# Patient Record
Sex: Male | Born: 1955 | Smoking: Never smoker
Health system: Southern US, Community
[De-identification: ages and names within clinical notes are randomized; demographics above are authoritative.]

## PROBLEM LIST (undated history)

## (undated) HISTORY — PX: WRIST SURGERY: SHX841

## (undated) HISTORY — PX: CHOLECYSTECTOMY: SHX55

---

## 2019-10-12 ENCOUNTER — Ambulatory Visit (INDEPENDENT_AMBULATORY_CARE_PROVIDER_SITE_OTHER): Payer: Self-pay | Admitting: Family Medicine

## 2019-10-12 ENCOUNTER — Encounter: Payer: Self-pay | Admitting: Family Medicine

## 2019-10-12 ENCOUNTER — Other Ambulatory Visit: Payer: Self-pay

## 2019-10-12 VITALS — BP 137/74 | HR 77 | Temp 99.0°F | Resp 18 | Ht 66.14 in | Wt 194.0 lb

## 2019-10-12 DIAGNOSIS — Z024 Encounter for examination for driving license: Secondary | ICD-10-CM

## 2019-10-12 NOTE — Progress Notes (Signed)
Subjective:  Patient ID: Blake Schmidt, male    DOB: 1956-03-24  Age: 63 y.o. MRN: [id number]  CC:  Chief Complaint  Patient presents with  . DOT Physical    HPI Blake Schmidt presents for   DOT physical.   Noted during chart review - positive Covid 19 test - did not disclose in check in/triage.  positive test 11/17, 11/12, and 10/29. Wants to have negative test.  Granddaughter had Covid 19 around 10/26-27.  No cough/dyspnea, no fever, no change in taste/smell. No dyspnea.  Granddaughter had negative repeat test. grandaughter lives with him.    DOT physical: Left arm fx in 1991 - no limitations.  No chronic medical problems.  No snoring, no daytime somnolence.  No CP with exertion.   History There are no active problems to display for this patient.  History reviewed. No pertinent past medical history. History reviewed. No pertinent surgical history. No Known Allergies Prior to Admission medications   Not on File   Social History   Socioeconomic History  . Marital status: Unknown    Spouse name: Not on file  . Number of children: Not on file  . Years of education: Not on file  . Highest education level: Not on file  Occupational History  . Not on file  Social Needs  . Financial resource strain: Not on file  . Food insecurity    Worry: Not on file    Inability: Not on file  . Transportation needs    Medical: Not on file    Non-medical: Not on file  Tobacco Use  . Smoking status: Never Smoker  . Smokeless tobacco: Never Used  Substance and Sexual Activity  . Alcohol use: Not on file  . Drug use: Not on file  . Sexual activity: Not on file  Lifestyle  . Physical activity    Days per week: Not on file    Minutes per session: Not on file  . Stress: Not on file  Relationships  . Social Musician on phone: Not on file    Gets together: Not on file    Attends religious service: Not on file    Active member of club or organization: Not on file     Attends meetings of clubs or organizations: Not on file    Relationship status: Not on file  . Intimate partner violence    Fear of current or ex partner: Not on file    Emotionally abused: Not on file    Physically abused: Not on file    Forced sexual activity: Not on file  Other Topics Concern  . Not on file  Social History Narrative  . Not on file    Review of Systems  Driver form reviewed, no concerns. See ppwk.   Objective:   Vitals:   10/12/19 1322  BP: 137/74  Pulse: 77  Resp: 18  Temp: 99 F (37.2 C)  TempSrc: Oral  SpO2: 96%  Weight: 194 lb (88 kg)  Height: 5' 6.14" (1.68 m)     Physical Exam Vitals signs reviewed.  Constitutional:      Appearance: He is well-developed.  HENT:     Head: Normocephalic and atraumatic.     Right Ear: External ear normal.     Left Ear: External ear normal.  Eyes:     Conjunctiva/sclera: Conjunctivae normal.     Pupils: Pupils are equal, round, and reactive to light.  Neck:     Musculoskeletal:  Normal range of motion and neck supple.     Thyroid: No thyromegaly.  Cardiovascular:     Rate and Rhythm: Normal rate and regular rhythm.     Heart sounds: Normal heart sounds.  Pulmonary:     Effort: Pulmonary effort is normal. No respiratory distress.     Breath sounds: Normal breath sounds. No wheezing.  Abdominal:     General: There is no distension.     Palpations: Abdomen is soft.     Tenderness: There is no abdominal tenderness.     Hernia: There is no hernia in the left inguinal area or right inguinal area.  Musculoskeletal: Normal range of motion.        General: No tenderness.  Lymphadenopathy:     Cervical: No cervical adenopathy.  Skin:    General: Skin is warm and dry.  Neurological:     Mental Status: He is alert and oriented to person, place, and time.     Deep Tendon Reflexes: Reflexes are normal and symmetric.  Psychiatric:        Behavior: Behavior normal.        Assessment & Plan:  Blake Schmidt is a 63 y.o. male . 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  Discussed recent COVID-19 positive test.  Initial test was positive on October 29, and has remained asymptomatic during the entire time.  Beyond 10 days from initial positive test and asymptomatic, unlikely contagious at this time.  Recommend against further repeat testing.  No orders of the defined types were placed in this encounter.  Patient Instructions       If you have lab work done today you will be contacted with your lab results within the next 2 weeks.  If you have not heard from [place] then please contact us. The fastest way to get your results is to register for My Chart.   IF you received an x-ray today, you will receive an invoice from [facility]. Please contact [facility] at [phone] with questions or concerns regarding your invoice.   IF you received labwork today, you will receive an invoice from [facility]. Please contact [facility] at [phone] with questions or concerns regarding your invoice.   Our billing staff will not be able to assist you with questions regarding bills from these companies.  You will be contacted with the lab results as soon as they are available. The fastest way to get your results is to activate your My Chart account. Instructions are located on the last page of this paperwork. If you have not heard from [place] regarding the results in 2 weeks, please contact this office.          Signed, Merri Ray, MD [facility]

## 2019-10-12 NOTE — Patient Instructions (Addendum)
   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   If you have lab work done today you will be contacted with your lab results within the next 2 weeks.  If you have not heard from [place] then please contact us. The fastest way to get your results is to register for My Chart.   IF you received an x-ray today, you will receive an invoice from [facility]. Please contact [facility] at [phone] with questions or concerns regarding your invoice.   IF you received labwork today, you will receive an invoice from [facility]. Please contact [facility] at [phone] with questions or concerns regarding your invoice.   Our billing staff will not be able to assist you with questions regarding bills from these companies.  You will be contacted with the lab results as soon as they are available. The fastest way to get your results is to activate your My Chart account. Instructions are located on the last page of this paperwork. If you have not heard from [place] regarding the results in 2 weeks, please contact this office.

## 2020-05-30 ENCOUNTER — Ambulatory Visit (INDEPENDENT_AMBULATORY_CARE_PROVIDER_SITE_OTHER): Payer: Medicare Other

## 2020-05-30 ENCOUNTER — Encounter (HOSPITAL_COMMUNITY): Payer: Self-pay

## 2020-05-30 ENCOUNTER — Other Ambulatory Visit: Payer: Self-pay

## 2020-05-30 ENCOUNTER — Ambulatory Visit (HOSPITAL_COMMUNITY)
Admission: EM | Admit: 2020-05-30 | Discharge: 2020-05-30 | Disposition: A | Discharge status: Home / Self Care | Payer: Medicare Other | Attending: Family Medicine | Admitting: Family Medicine

## 2020-05-30 DIAGNOSIS — R52 Pain, unspecified: Secondary | ICD-10-CM

## 2020-05-30 DIAGNOSIS — K529 Noninfective gastroenteritis and colitis, unspecified: Secondary | ICD-10-CM | POA: Insufficient documentation

## 2020-05-30 DIAGNOSIS — R509 Fever, unspecified: Secondary | ICD-10-CM | POA: Diagnosis not present

## 2020-05-30 DIAGNOSIS — R112 Nausea with vomiting, unspecified: Secondary | ICD-10-CM | POA: Diagnosis not present

## 2020-05-30 DIAGNOSIS — R109 Unspecified abdominal pain: Secondary | ICD-10-CM | POA: Diagnosis not present

## 2020-05-30 DIAGNOSIS — R197 Diarrhea, unspecified: Secondary | ICD-10-CM

## 2020-05-30 LAB — COMPREHENSIVE METABOLIC PANEL
ALT: 113 U/L — ABNORMAL HIGH (ref 0–44)
AST: 27 U/L (ref 15–41)
Albumin: 3.1 g/dL — ABNORMAL LOW (ref 3.5–5.0)
Alkaline Phosphatase: 149 U/L — ABNORMAL HIGH (ref 38–126)
Anion gap: 7 (ref 5–15)
BUN: 11 mg/dL (ref 8–23)
CO2: 27 mmol/L (ref 22–32)
Calcium: 8.9 mg/dL (ref 8.9–10.3)
Chloride: 109 mmol/L (ref 98–111)
Creatinine, Ser: 1.13 mg/dL (ref 0.61–1.24)
GFR calc Af Amer: >60 mL/min (ref >60)
GFR calc non Af Amer: >60 mL/min (ref >60)
Glucose, Bld: 107 mg/dL — ABNORMAL HIGH (ref 70–99)
Potassium: 4.4 mmol/L (ref 3.5–5.1)
Sodium: 143 mmol/L (ref 135–145)
Total Bilirubin: 1.3 mg/dL — ABNORMAL HIGH (ref 0.3–1.2)
Total Protein: 7.2 g/dL (ref 6.5–8.1)

## 2020-05-30 LAB — CBC WITH DIFFERENTIAL/PLATELET
Abs Immature Granulocytes: 0.03 10*3/uL (ref 0.00–0.07)
Basophils Absolute: 0 10*3/uL (ref 0.0–0.1)
Basophils Relative: 1 %
Eosinophils Absolute: 0.4 10*3/uL (ref 0.0–0.5)
Eosinophils Relative: 9 %
HCT: 44.2 % (ref 39.0–52.0)
Hemoglobin: 14.8 g/dL (ref 13.0–17.0)
Immature Granulocytes: 1 %
Lymphocytes Relative: 28 %
Lymphs Abs: 1.3 10*3/uL (ref 0.7–4.0)
MCH: 31.4 pg (ref 26.0–34.0)
MCHC: 33.5 g/dL (ref 30.0–36.0)
MCV: 93.8 fL (ref 80.0–100.0)
Monocytes Absolute: 0.5 10*3/uL (ref 0.1–1.0)
Monocytes Relative: 11 %
Neutro Abs: 2.4 10*3/uL (ref 1.7–7.7)
Neutrophils Relative %: 50 %
Platelets: 151 10*3/uL (ref 150–400)
RBC: 4.71 MIL/uL (ref 4.22–5.81)
RDW: 14.3 % (ref 11.5–15.5)
WBC: 4.6 10*3/uL (ref 4.0–10.5)
nRBC: 0 % (ref 0.0–0.2)

## 2020-05-30 LAB — POCT URINALYSIS DIP (DEVICE)
Glucose, UA: NEGATIVE mg/dL
Hgb urine dipstick: NEGATIVE
Leukocytes,Ua: NEGATIVE
Nitrite: NEGATIVE
Protein, ur: NEGATIVE mg/dL
Specific Gravity, Urine: 1.02 (ref 1.005–1.030)
Urobilinogen, UA: >=8 mg/dL (ref 0.0–1.0)
pH: 7.5 (ref 5.0–8.0)

## 2020-05-30 NOTE — ED Triage Notes (Addendum)
Pt reports fever (102.5-100.5 F) and body aches 3 days after eating tacos at at restaurant and also had diarrhea and abdominal pain x 2 days. Pt taking Tylenol, last dose 3-4 hrs ago.    Pt had a negative COVID test yesterday.

## 2020-05-30 NOTE — ED Provider Notes (Signed)
[facility]    CSN: [id number] Arrival date & time: 05/30/20  1305      History   Chief Complaint Chief Complaint  Patient presents with  . Fever  . Generalized Body Aches    HPI Blake Schmidt is a 64 y.o. male.   Pt is an overall healthy 64 year old male presenting with persistent generalized body aches and fever x 3 days. Reports eating tacos Sunday night and thinking this was the cause of his severe abdominal pain on Monday. Reports vomiting and diarrhea Monday and Tuesday. Reports increased urinary frequency but denies burning with urination, hematuria, or difficulty urinating. Has been drinking Pedialyte and water but still reports fatigue, fevers, and generalized aching. Has not been around anyone with similar symptoms. Received both Covid-19 vaccines and had negative Covid test yesterday. \ ROS per HPI      History reviewed. No pertinent past medical history.  There are no problems to display for this patient.   Past Surgical History:  Procedure Laterality Date  . WRIST SURGERY         Home Medications    Prior to Admission medications   Medication Sig Start Date End Date Taking? Authorizing Provider  acetaminophen (TYLENOL) 500 MG tablet Take 500 mg by mouth every 6 (six) hours as needed.   Yes [provider]    Family History History reviewed. No pertinent family history.  Social History Social History   Tobacco Use  . Smoking status: Never Smoker  . Smokeless tobacco: Never Used  Substance Use Topics  . Alcohol use: Never  . Drug use: Never     Allergies   Patient has no known allergies.   Review of Systems Review of Systems  Constitutional: Positive for appetite change, fatigue and fever.  HENT: Negative.   Respiratory: Negative.   Cardiovascular: Negative.   Gastrointestinal: Positive for abdominal pain, diarrhea, nausea and vomiting.       Monday/Tuesday  Endocrine: Negative for polydipsia, polyphagia and  polyuria.  Genitourinary: Positive for frequency. Negative for difficulty urinating, dysuria, hematuria and urgency.  Musculoskeletal: Positive for back pain.       Low back and mid-left back pain  Neurological: Positive for weakness.       Generalized     Physical Exam Triage Vital Signs ED Triage Vitals  Enc Vitals Group     BP 05/30/20 1358 116/81     Pulse --      Resp 05/30/20 1358 20     Temp 05/30/20 1358 98.7 F (37.1 C)     Temp Source 05/30/20 1358 Oral     SpO2 05/30/20 1358 98 %     Weight --      Height --      Head Circumference --      Peak Flow --      Pain Score 05/30/20 1354 0     Pain Loc --      Pain Edu? --      Excl. in GC? --    No data found.  Updated Vital Signs BP 116/81 (BP Location: Right Arm)   Temp 98.7 F (37.1 C) (Oral)   Resp 20   SpO2 98%   Visual Acuity Right Eye Distance:   Left Eye Distance:   Bilateral Distance:    Right Eye Near:   Left Eye Near:    Bilateral Near:     Physical Exam Constitutional:      Appearance: Normal appearance. He is normal weight.  HENT:     Head: Normocephalic.  Cardiovascular:     Rate and Rhythm: Normal rate.  Pulmonary:     Effort: Pulmonary effort is normal.  Abdominal:     General: Abdomen is flat. Bowel sounds are normal. There is no distension.     Palpations: Abdomen is soft. There is no mass.     Tenderness: There is abdominal tenderness. There is left CVA tenderness. There is no right CVA tenderness or guarding.     Hernia: No hernia is present.     Comments: RUQ tenderness  Musculoskeletal:        General: Normal range of motion.  Skin:    General: Skin is warm and dry.     Capillary Refill: Capillary refill takes less than 2 seconds.  Neurological:     General: No focal deficit present.     Mental Status: He is alert.  Psychiatric:        Mood and Affect: Mood normal.        Behavior: Behavior normal. Behavior is cooperative.      UC Treatments / Results   Labs (all labs ordered are listed, but only abnormal results are displayed) Labs Reviewed  COMPREHENSIVE METABOLIC PANEL - Abnormal; Notable for the following components:      Result Value   Glucose, Bld 107 (*)    Albumin 3.1 (*)    ALT 113 (*)    Alkaline Phosphatase 149 (*)    Total Bilirubin 1.3 (*)    All other components within normal limits  POCT URINALYSIS DIP (DEVICE) - Abnormal; Notable for the following components:   Bilirubin Urine SMALL (*)    Ketones, ur TRACE (*)    All other components within normal limits  CBC WITH DIFFERENTIAL/PLATELET    EKG   Radiology No results found.  Procedures Procedures (including critical care time)  Medications Ordered in UC Medications - No data to display  Initial Impression / Assessment and Plan / UC Course  I have reviewed the triage vital signs and the nursing notes.  Pertinent labs & imaging results that were available during my care of the patient were reviewed by me and considered in my medical decision making (see chart for details).  Clinical Course as of Jun 02 1539  Fri May 30, 2020  1456 POCT Urinalysis, Dipstick [TB]  1535 DG Abd 2 Views [TB]    Clinical Course User Index [TB] Dahlia Byes A, NP   Gastroenteritis Patient presented today with fever, nausea, vomiting and diarrhea after eating tacos a few days ago.  Most symptoms have resolved at this time.  Still having some right upper quadrant discomfort, generalized aching and fatigue. We will draw some basic lab work Abdominal x-ray without any concerning findings. Urine without infection There is still some concern for cholecystitis Patient otherwise nontoxic or ill-appearing at this time.  Will follow up with lab work We will have him follow-up with primary care or go to the ER for worsening symptoms.   Final Clinical Impressions(s) / UC Diagnoses   Final diagnoses:  Gastroenteritis     Discharge Instructions     Your x-ray did not show  anything concerning.  Your blood work so far is normal.  We are still waiting on one of your labs and I will call you if any of these results are abnormal. Your urine did not show any infection. Make sure you are drinking plenty of fluids and staying hydrated If your symptoms worsen to  include more severe abdominal pain, nausea or vomiting you will need to go to the ER for further management.  Otherwise you  can follow with primary care    ED Prescriptions    None     PDMP not reviewed this encounter.   Janace Aris, NP 06/01/20 1540

## 2020-05-30 NOTE — Discharge Instructions (Signed)
Your x-ray did not show anything concerning.  Your blood work so far is normal.  We are still waiting on one of your labs and I will call you if any of these results are abnormal. Your urine did not show any infection. Make sure you are drinking plenty of fluids and staying hydrated If your symptoms worsen to include more severe abdominal pain, nausea or vomiting you will need to go to the ER for further management.  Otherwise you  can follow with primary care

## 2020-12-12 ENCOUNTER — Other Ambulatory Visit: Payer: Self-pay

## 2020-12-12 ENCOUNTER — Inpatient Hospital Stay (HOSPITAL_COMMUNITY)
Admission: EM | Admit: 2020-12-12 | Discharge: 2020-12-16 | DRG: 444 | Disposition: A | Discharge status: Home / Self Care | Payer: Medicare Other | Attending: Internal Medicine | Admitting: Internal Medicine

## 2020-12-12 ENCOUNTER — Emergency Department (HOSPITAL_COMMUNITY): Payer: Medicare Other

## 2020-12-12 ENCOUNTER — Encounter (HOSPITAL_COMMUNITY): Payer: Self-pay | Admitting: *Deleted

## 2020-12-12 DIAGNOSIS — K851 Biliary acute pancreatitis without necrosis or infection: Secondary | ICD-10-CM | POA: Diagnosis not present

## 2020-12-12 DIAGNOSIS — N400 Enlarged prostate without lower urinary tract symptoms: Secondary | ICD-10-CM | POA: Diagnosis present

## 2020-12-12 DIAGNOSIS — K769 Liver disease, unspecified: Secondary | ICD-10-CM | POA: Diagnosis present

## 2020-12-12 DIAGNOSIS — Z9049 Acquired absence of other specified parts of digestive tract: Secondary | ICD-10-CM

## 2020-12-12 DIAGNOSIS — K8071 Calculus of gallbladder and bile duct without cholecystitis with obstruction: Secondary | ICD-10-CM | POA: Diagnosis not present

## 2020-12-12 DIAGNOSIS — Z20822 Contact with and (suspected) exposure to covid-19: Secondary | ICD-10-CM | POA: Diagnosis present

## 2020-12-12 DIAGNOSIS — K859 Acute pancreatitis without necrosis or infection, unspecified: Secondary | ICD-10-CM

## 2020-12-12 DIAGNOSIS — Z6831 Body mass index (BMI) 31.0-31.9, adult: Secondary | ICD-10-CM

## 2020-12-12 DIAGNOSIS — K831 Obstruction of bile duct: Secondary | ICD-10-CM | POA: Diagnosis present

## 2020-12-12 DIAGNOSIS — E669 Obesity, unspecified: Secondary | ICD-10-CM | POA: Diagnosis present

## 2020-12-12 LAB — COMPREHENSIVE METABOLIC PANEL
ALT: 202 U/L — ABNORMAL HIGH (ref 0–44)
AST: 79 U/L — ABNORMAL HIGH (ref 15–41)
Albumin: 4.1 g/dL (ref 3.5–5.0)
Alkaline Phosphatase: 430 U/L — ABNORMAL HIGH (ref 38–126)
Anion gap: 11 (ref 5–15)
BUN: 20 mg/dL (ref 8–23)
CO2: 24 mmol/L (ref 22–32)
Calcium: 9.7 mg/dL (ref 8.9–10.3)
Chloride: 103 mmol/L (ref 98–111)
Creatinine, Ser: 1.08 mg/dL (ref 0.61–1.24)
GFR, Estimated: >60 mL/min (ref >60)
Glucose, Bld: 126 mg/dL — ABNORMAL HIGH (ref 70–99)
Potassium: 4 mmol/L (ref 3.5–5.1)
Sodium: 138 mmol/L (ref 135–145)
Total Bilirubin: 5.5 mg/dL — ABNORMAL HIGH (ref 0.3–1.2)
Total Protein: 8.3 g/dL — ABNORMAL HIGH (ref 6.5–8.1)

## 2020-12-12 LAB — URINALYSIS, ROUTINE W REFLEX MICROSCOPIC
Glucose, UA: NEGATIVE mg/dL
Hgb urine dipstick: NEGATIVE
Ketones, ur: NEGATIVE mg/dL
Nitrite: NEGATIVE
Protein, ur: 30 mg/dL — AB
Specific Gravity, Urine: 1.03 (ref 1.005–1.030)
pH: 5 (ref 5.0–8.0)

## 2020-12-12 LAB — CBC
HCT: 44.2 % (ref 39.0–52.0)
Hemoglobin: 15 g/dL (ref 13.0–17.0)
MCH: 32.3 pg (ref 26.0–34.0)
MCHC: 33.9 g/dL (ref 30.0–36.0)
MCV: 95.3 fL (ref 80.0–100.0)
Platelets: 259 10*3/uL (ref 150–400)
RBC: 4.64 MIL/uL (ref 4.22–5.81)
RDW: 14.6 % (ref 11.5–15.5)
WBC: 13.7 10*3/uL — ABNORMAL HIGH (ref 4.0–10.5)
nRBC: 0 % (ref 0.0–0.2)

## 2020-12-12 LAB — LIPASE, BLOOD: Lipase: 1938 U/L — ABNORMAL HIGH (ref 11–51)

## 2020-12-12 LAB — SARS CORONAVIRUS 2 BY RT PCR (HOSPITAL ORDER, PERFORMED IN ~~LOC~~ HOSPITAL LAB): SARS Coronavirus 2: NEGATIVE

## 2020-12-12 MED ORDER — HYDROMORPHONE HCL 1 MG/ML IJ SOLN
0.5000 mg | INTRAMUSCULAR | Status: DC | PRN
  Administered 2020-12-13: 1 mg via INTRAVENOUS
  Filled 2020-12-12: qty 1

## 2020-12-12 MED ORDER — ONDANSETRON HCL 4 MG PO TABS: 4.0000 mg | ORAL_TABLET | Freq: Four times a day (QID) | ORAL | Status: DC | PRN

## 2020-12-12 MED ORDER — ACETAMINOPHEN 325 MG PO TABS: 650.0000 mg | ORAL_TABLET | Freq: Four times a day (QID) | ORAL | Status: DC | PRN

## 2020-12-12 MED ORDER — ONDANSETRON HCL 4 MG/2ML IJ SOLN
4.0000 mg | Freq: Once | INTRAMUSCULAR | Status: AC
  Administered 2020-12-12: 4 mg via INTRAVENOUS
  Filled 2020-12-12: qty 2

## 2020-12-12 MED ORDER — SODIUM CHLORIDE 0.9 % IV SOLN: Freq: Once | INTRAVENOUS | Status: AC

## 2020-12-12 MED ORDER — PIPERACILLIN-TAZOBACTAM 3.375 G IVPB 30 MIN
3.3750 g | Freq: Once | INTRAVENOUS | Status: AC
  Administered 2020-12-12: 3.375 g via INTRAVENOUS
  Filled 2020-12-12: qty 50

## 2020-12-12 MED ORDER — ACETAMINOPHEN 650 MG RE SUPP: 650.0000 mg | Freq: Four times a day (QID) | RECTAL | Status: DC | PRN

## 2020-12-12 MED ORDER — HYDROMORPHONE HCL 1 MG/ML IJ SOLN
1.0000 mg | Freq: Once | INTRAMUSCULAR | Status: AC | PRN
  Administered 2020-12-12: 1 mg via INTRAVENOUS
  Filled 2020-12-12: qty 1

## 2020-12-12 MED ORDER — SODIUM CHLORIDE 0.9 % IV SOLN: INTRAVENOUS | Status: DC

## 2020-12-12 MED ORDER — SODIUM CHLORIDE 0.9 % IV SOLN: INTRAVENOUS | Status: AC

## 2020-12-12 MED ORDER — SODIUM CHLORIDE 0.9 % IV BOLUS
1000.0000 mL | Freq: Once | INTRAVENOUS | Status: AC
  Administered 2020-12-12: 1000 mL via INTRAVENOUS

## 2020-12-12 MED ORDER — HYDROMORPHONE HCL 1 MG/ML IJ SOLN
1.0000 mg | Freq: Once | INTRAMUSCULAR | Status: AC
  Administered 2020-12-12: 1 mg via INTRAVENOUS
  Filled 2020-12-12: qty 1

## 2020-12-12 MED ORDER — ONDANSETRON HCL 4 MG/2ML IJ SOLN: 4.0000 mg | Freq: Four times a day (QID) | INTRAMUSCULAR | Status: DC | PRN

## 2020-12-12 MED ORDER — IOHEXOL 300 MG/ML  SOLN
100.0000 mL | Freq: Once | INTRAMUSCULAR | Status: AC | PRN
  Administered 2020-12-12: 100 mL via INTRAVENOUS

## 2020-12-12 NOTE — ED Triage Notes (Signed)
Pt had cholecystectomy 10/21. He states he began feeling pain in RUQ 2 weeks after surgery and has had pain since. Noi NVD.

## 2020-12-12 NOTE — ED Notes (Signed)
Urine culture sent to lab.

## 2020-12-12 NOTE — Consult Note (Signed)
Referring Provider: ED Primary Care Physician:  Patient, No Pcp Per Primary Gastroenterologist:  Gentry Fitz  Reason for Consultation:  Biliary obstruction, pancreatitis  HPI: Blake Schmidt is a 65 y.o. male with history of cholecystectomy in 08/2020 presenting with biliary obstruction and pancreatitis.  Patient states he had his gallbladder removed in October 2021.  He states he initially felt better for 2 or 3 weeks, then he started having intermittent right upper quadrant abdominal pain.  Pain typically lasts for 2 to 3 hours but then resolved.  However, yesterday, he started experiencing extreme pain in the right upper quadrant and epigastrium, and it did not get any better, thus he presented to the ED today.  He reports nausea but no vomiting.  Denies fever or chills.  Denies GERD, dysphagia, changes in appetite, unexplained weight loss, changes in bowel habits, melena, or hematochezia.  Denies alcohol use.  Denies NSAID or blood thinner use.  Denies any new medications.  Denies family history of biliary or pancreatic disorders.  He reports a colonoscopy in [place] ~5 years ago.   History reviewed. No pertinent past medical history.  Past Surgical History:  Procedure Laterality Date  . WRIST SURGERY      Prior to Admission medications   Not on File    Scheduled Meds: Continuous Infusions: PRN Meds:.  Allergies as of 12/12/2020  . (No Known Allergies)    No family history on file.  Social History   Socioeconomic History  . Marital status: Unknown    Spouse name: Not on file  . Number of children: Not on file  . Years of education: Not on file  . Highest education level: Not on file  Occupational History  . Not on file  Tobacco Use  . Smoking status: Never Smoker  . Smokeless tobacco: Never Used  Substance and Sexual Activity  . Alcohol use: Never  . Drug use: Never  . Sexual activity: Not on file  Other Topics Concern  . Not on file  Social History  Narrative  . Not on file   Social Determinants of Health   Financial Resource Strain: Not on file  Food Insecurity: Not on file  Transportation Needs: Not on file  Physical Activity: Not on file  Stress: Not on file  Social Connections: Not on file  Intimate Partner Violence: Not on file    Review of Systems: Review of Systems  Constitutional: Negative for chills, fever and weight loss.  HENT: Negative for hearing loss and tinnitus.   Eyes: Negative for pain and redness.  Respiratory: Negative for cough and shortness of breath.   Cardiovascular: Negative for chest pain and palpitations.  Gastrointestinal: Positive for abdominal pain and nausea. Negative for blood in stool, constipation, diarrhea, heartburn, melena and vomiting.  Genitourinary: Negative for flank pain and hematuria.  Musculoskeletal: Negative for falls and joint pain.  Skin: Negative for itching and rash.  Neurological: Negative for seizures and loss of consciousness.  Endo/Heme/Allergies: Negative for polydipsia. Does not bruise/bleed easily.  Psychiatric/Behavioral: Negative for substance abuse. The patient is not nervous/anxious.      Physical Exam: Vital signs: Vitals:   12/12/20 1500 12/12/20 1612  BP: 114/79 103/67  Pulse: 80 77  Resp: 15 20  Temp:    SpO2: 92% 95%      Physical Exam Vitals reviewed.  Constitutional:      General: He is not in acute distress. HENT:     Head: Normocephalic and atraumatic.     Nose: Nose normal.  No congestion.     Mouth/Throat:     Mouth: Mucous membranes are moist.     Pharynx: Oropharynx is clear.  Eyes:     General: Scleral icterus present.     Extraocular Movements: Extraocular movements intact.  Cardiovascular:     Rate and Rhythm: Normal rate and regular rhythm.     Pulses: Normal pulses.  Pulmonary:     Effort: Pulmonary effort is normal. No respiratory distress.     Breath sounds: Normal breath sounds.  Abdominal:     General: Bowel sounds are  normal. There is no distension.     Palpations: Abdomen is soft. There is no mass.     Tenderness: There is abdominal tenderness (moderate). There is guarding. There is no rebound.     Hernia: No hernia is present.  Musculoskeletal:        General: No swelling or tenderness.     Cervical back: Normal range of motion and neck supple.  Skin:    General: Skin is warm and dry.  Neurological:     General: No focal deficit present.     Mental Status: He is oriented to person, place, and time. He is lethargic.  Psychiatric:        Mood and Affect: Mood normal.        Behavior: Behavior normal. Behavior is cooperative.      GI:  Lab Results: Recent Labs    12/12/20 1225  WBC 13.7*  HGB 15.0  HCT 44.2  PLT 259   BMET Recent Labs    12/12/20 1225  NA 138  K 4.0  CL 103  CO2 24  GLUCOSE 126*  BUN 20  CREATININE 1.08  CALCIUM 9.7   LFT Recent Labs    12/12/20 1225  PROT 8.3*  ALBUMIN 4.1  AST 79*  ALT 202*  ALKPHOS 430*  BILITOT 5.5*   PT/INR No results for input(s): LABPROT, INR in the last 72 hours.   Studies/Results: CT ABDOMEN PELVIS W CONTRAST  Result Date: 12/12/2020 CLINICAL DATA:  65 year old with abdominal pain. Fever and history of cholecystectomy 3 months ago. Pancreatitis and transaminitis. EXAM: CT ABDOMEN AND PELVIS WITH CONTRAST TECHNIQUE: Multidetector CT imaging of the abdomen and pelvis was performed using the standard protocol following bolus administration of intravenous contrast. CONTRAST:  OMNIPAQUE IOHEXOL 300 MG/ML  SOLN COMPARISON:  None. FINDINGS: Lower chest: Tiny calcified granuloma in the left upper lobe on sequence 6, 6. Small patchy peripheral lung densities likely represent atelectasis or mild scarring. No large pleural effusions. Evidence for coronary artery calcifications. Hepatobiliary: Intrahepatic and extrahepatic biliary dilatation. Gallbladder has been removed. Tiny nodular density along the right inferior hepatic lobe on  sequence 2 image 47 and sequence 4, image 93. Borders of this small structure are poorly defined and this nodule area measures 1.4 x 1.1 x 1.5 cm. No other suspicious hepatic lesions. Pancreas: Common bile duct is dilated down to the ampulla. Proximal common bile duct measures 1.5 cm. Distal common bile duct measures up to 1.3 cm on sequence 2 image 43. Dilatation of the proximal pancreatic duct measuring 0.7 cm on series 2, image 44. No significant peripancreatic inflammation. No evidence for pancreatic necrosis or pseudocyst formation. No obvious pancreatic mass or lesion. Fullness near the ampulla on sequence 2 image 47. Difficult to exclude an occult ampullary mass or stone in this location. Spleen: Normal in size without focal abnormality. Adrenals/Urinary Tract: Normal appearance of the adrenal glands. Nodular structure extending into  the base of the posterior bladder. This likely represents an enlarged median lobe of the prostate. Small amount of fluid in the bladder and difficult to evaluate for bladder wall thickening. Negative for hydronephrosis. Normal appearance of both kidneys. No suspicious renal lesions. Stomach/Bowel: No inflammatory changes around the duodenum. Normal appearance of the stomach without dilatation. Scattered colonic diverticula without acute inflammatory changes. No acute inflammatory changes involving the appendix. Negative for bowel obstruction. Vascular/Lymphatic: Normal caliber of the abdominal aorta with minimal atherosclerotic disease. Main visceral arteries are patent. No lymph node enlargement in the abdomen or pelvis. Reproductive: Prostate is enlarged and there is nodular structure extending into the right posterior base of the bladder. This likely represents an enlarged median lobe. Prostate roughly measures 6.8 x 5.4 x 5.8 cm. Other: Question a tiny left inguinal hernia containing fat. Negative for free air. Negative for free fluid. Musculoskeletal: No acute bone  abnormality. IMPRESSION: 1. Moderate to severe intrahepatic and extrahepatic biliary dilatation without an obvious obstructing mass or stone. The biliary system is dilated all the way to the duodenum which raises concern for occult ampullary mass, radiolucent stone or stenosis. There is also dilatation of the main pancreatic duct. Recommend GI consultation with MRCP/ERCP. 2. No significant inflammatory changes around the pancreas despite the markedly elevated lipase. No evidence for pancreatic necrosis or pseudocyst formations. 3. Indeterminate nodular density near the right inferior hepatic lobe with poorly defined borders and possibly mild surrounding inflammatory changes. Structure measures up to 1.5 cm. Based on the mild stranding in this area, suspect this could be postinflammatory and related to previous gallbladder surgery. Primary liver lesion is thought to be less likely but cannot be excluded and recommend follow-up. 4. Prostate hypertrophy with enlarged median lobe. Recommend correlation with PSA level. Electronically Signed   By: Richarda Overlie M.D.   On: 12/12/2020 16:10    Impression: Pancreatitis due to biliary obstruction: Lipase 1938 and severe epigastric abdominal pain, there are no acute pancreatitis per CT imaging. -T bili 5.5/AST 79/ALT 202/ALP 430 -Mild leukocytosis (13.7) -CT 12/12/20: Moderate to severe intrahepatic and extrahepatic biliary dilatation without an obvious obstructing mass or stone. The biliary system is dilated all the way to the duodenum which raises concern for occult ampullary mass, radiolucent stone or stenosis. There is also dilatation of the main pancreatic duct. No significant inflammatory changes around the pancreas despite the markedly elevated lipase. No evidence for pancreatic necrosis or pseudocyst formations. -BUN 20/ Cr 1.08, stable as compared to baseline  -Cholecystectomy 08/2020  Plan: Fluid resuscitation: Normal saline ordered at a rate of 200 cc/h  first 48 hours.  Plan to decrease rate of IVF to 100cc/h after 48 hours as long as renal function remains stable.  Pain control.  Continue to trend LFTs and renal function.  Dr. Dulce Sellar plans to reassess the patient tomorrow to determine whether or not the patient would benefit from MRCP versus ERCP.  In case ERCP is needed, I thoroughly discussed the procedure with the patient to include nature, alternatives, benefits, and risks (including but not limited to bleeding, infection, perforation, anesthesia/cardiac and pulmonary complications). Patient verbalized understanding and gave verbal consent to proceed with ERCP if needed.  Clear liquid diet today, NPO after midnight in case MRCP or ERCP is completed tomorrow.   [facility] will follow.   LOS: 0 days   Edrick Kins  PA-C 12/12/2020, 4:49 PM  Contact #  [phone]

## 2020-12-12 NOTE — ED Provider Notes (Signed)
[facility] DEPT Provider Note   CSN: [id number] Arrival date & time: 12/12/20  1154     History Chief Complaint  Patient presents with  . Abdominal Pain    Blake Schmidt is a 65 y.o. male w/ hx of cholecystectomy in [place] in Oct 2021, presenting to ED with abdominal pain.  Abrupt onset of epigastric and RUQ pain yesterday around 2 pm, steadily worsening, never had this before, sharp pain, currently 10/10, responded well to PO pain medication at intake but now returning.  It does not radiate.  Reports nausea, no vomiting, no diarrhea.  No fevers or chills.  Denies any other medical problems.  Takes no medications normally.  NKDA  He lives here in [place]  HPI     History reviewed. No pertinent past medical history.  Patient Active Problem List   Diagnosis Date Noted  . Acute biliary pancreatitis 12/12/2020  . Common bile duct (CBD) obstruction 12/12/2020    Past Surgical History:  Procedure Laterality Date  . CHOLECYSTECTOMY    . WRIST SURGERY         No family history on file.  Social History   Tobacco Use  . Smoking status: Never Smoker  . Smokeless tobacco: Never Used  Substance Use Topics  . Alcohol use: Never  . Drug use: Never    Home Medications Prior to Admission medications   Not on File    Allergies    Other  Review of Systems   Review of Systems  Constitutional: Negative for chills and fever.  HENT: Negative for ear pain and sore throat.   Eyes: Negative for pain and visual disturbance.  Respiratory: Negative for cough and shortness of breath.   Cardiovascular: Negative for chest pain and palpitations.  Gastrointestinal: Positive for abdominal pain and nausea. Negative for diarrhea and vomiting.  Genitourinary: Negative for dysuria and hematuria.  Musculoskeletal: Negative for arthralgias and myalgias.  Skin: Negative for color change and rash.  Neurological: Negative for syncope and headaches.  All  other systems reviewed and are negative.   Physical Exam Updated Vital Signs BP 128/72   Pulse 75   Temp 98.9 F (37.2 C) (Oral)   Resp 15   Ht _0  (1.676 m)   Wt 88 kg   SpO2 96%   BMI 31.31 kg/m   Physical Exam Constitutional:      General: He is not in acute distress. HENT:     Head: Normocephalic and atraumatic.  Eyes:     General: Scleral icterus present.     Conjunctiva/sclera: Conjunctivae normal.  Cardiovascular:     Rate and Rhythm: Normal rate and regular rhythm.  Pulmonary:     Effort: Pulmonary effort is normal. No respiratory distress.  Abdominal:     General: There is no distension.     Tenderness: There is abdominal tenderness in the right upper quadrant and epigastric area. There is guarding. Negative signs include McBurney's sign.  Skin:    General: Skin is warm and dry.  Neurological:     General: No focal deficit present.     Mental Status: He is alert and oriented to person, place, and time. Mental status is at baseline.  Psychiatric:        Mood and Affect: Mood normal.        Behavior: Behavior normal.     ED Results / Procedures / Treatments   Labs (all labs ordered are listed, but only abnormal results are displayed) Labs Reviewed  LIPASE, BLOOD - Abnormal; Notable for the following components:      Result Value   Lipase 1,938 (*)    All other components within normal limits  COMPREHENSIVE METABOLIC PANEL - Abnormal; Notable for the following components:   Glucose, Bld 126 (*)    Total Protein 8.3 (*)    AST 79 (*)    ALT 202 (*)    Alkaline Phosphatase 430 (*)    Total Bilirubin 5.5 (*)    All other components within normal limits  CBC - Abnormal; Notable for the following components:   WBC 13.7 (*)    All other components within normal limits  URINALYSIS, ROUTINE W REFLEX MICROSCOPIC - Abnormal; Notable for the following components:   Color, Urine AMBER (*)    Bilirubin Urine MODERATE (*)    Protein, ur 30 (*)     Leukocytes,Ua TRACE (*)    Bacteria, UA FEW (*)    All other components within normal limits  SARS CORONAVIRUS 2 BY RT PCR (HOSPITAL ORDER, [facility])  COMPREHENSIVE METABOLIC PANEL  CBC  LIPASE, BLOOD  HIV ANTIBODY (ROUTINE TESTING W REFLEX)    EKG None  Radiology CT ABDOMEN PELVIS W CONTRAST  Result Date: 12/12/2020 CLINICAL DATA:  65 year old with abdominal pain. Fever and history of cholecystectomy 3 months ago. Pancreatitis and transaminitis. EXAM: CT ABDOMEN AND PELVIS WITH CONTRAST TECHNIQUE: Multidetector CT imaging of the abdomen and pelvis was performed using the standard protocol following bolus administration of intravenous contrast. CONTRAST:  116m OMNIPAQUE IOHEXOL 300 MG/ML  SOLN COMPARISON:  None. FINDINGS: Lower chest: Tiny calcified granuloma in the left upper lobe on sequence 6, 6. Small patchy peripheral lung densities likely represent atelectasis or mild scarring. No large pleural effusions. Evidence for coronary artery calcifications. Hepatobiliary: Intrahepatic and extrahepatic biliary dilatation. Gallbladder has been removed. Tiny nodular density along the right inferior hepatic lobe on sequence 2 image 47 and sequence 4, image 93. Borders of this small structure are poorly defined and this nodule area measures 1.4 x 1.1 x 1.5 cm. No other suspicious hepatic lesions. Pancreas: Common bile duct is dilated down to the ampulla. Proximal common bile duct measures 1.5 cm. Distal common bile duct measures up to 1.3 cm on sequence 2 image 43. Dilatation of the proximal pancreatic duct measuring 0.7 cm on series 2, image 44. No significant peripancreatic inflammation. No evidence for pancreatic necrosis or pseudocyst formation. No obvious pancreatic mass or lesion. Fullness near the ampulla on sequence 2 image 47. Difficult to exclude an occult ampullary mass or stone in this location. Spleen: Normal in size without focal abnormality. Adrenals/Urinary  Tract: Normal appearance of the adrenal glands. Nodular structure extending into the base of the posterior bladder. This likely represents an enlarged median lobe of the prostate. Small amount of fluid in the bladder and difficult to evaluate for bladder wall thickening. Negative for hydronephrosis. Normal appearance of both kidneys. No suspicious renal lesions. Stomach/Bowel: No inflammatory changes around the duodenum. Normal appearance of the stomach without dilatation. Scattered colonic diverticula without acute inflammatory changes. No acute inflammatory changes involving the appendix. Negative for bowel obstruction. Vascular/Lymphatic: Normal caliber of the abdominal aorta with minimal atherosclerotic disease. Main visceral arteries are patent. No lymph node enlargement in the abdomen or pelvis. Reproductive: Prostate is enlarged and there is nodular structure extending into the right posterior base of the bladder. This likely represents an enlarged median lobe. Prostate roughly measures 6.8 x 5.4 x 5.8 cm. Other:  Question a tiny left inguinal hernia containing fat. Negative for free air. Negative for free fluid. Musculoskeletal: No acute bone abnormality. IMPRESSION: 1. Moderate to severe intrahepatic and extrahepatic biliary dilatation without an obvious obstructing mass or stone. The biliary system is dilated all the way to the duodenum which raises concern for occult ampullary mass, radiolucent stone or stenosis. There is also dilatation of the main pancreatic duct. Recommend GI consultation with MRCP/ERCP. 2. No significant inflammatory changes around the pancreas despite the markedly elevated lipase. No evidence for pancreatic necrosis or pseudocyst formations. 3. Indeterminate nodular density near the right inferior hepatic lobe with poorly defined borders and possibly mild surrounding inflammatory changes. Structure measures up to 1.5 cm. Based on the mild stranding in this area, suspect this could be  postinflammatory and related to previous gallbladder surgery. Primary liver lesion is thought to be less likely but cannot be excluded and recommend follow-up. 4. Prostate hypertrophy with enlarged median lobe. Recommend correlation with PSA level. Electronically Signed   By: Markus Daft M.D.   On: 12/12/2020 16:10    Procedures Procedures (including critical care time)  Medications Ordered in ED Medications  0.9 %  sodium chloride infusion (has no administration in time range)  0.9 %  sodium chloride infusion ( Intravenous New Bag/Given 12/12/20 1737)  HYDROmorphone (DILAUDID) injection 0.5-1 mg (has no administration in time range)  acetaminophen (TYLENOL) tablet 650 mg (has no administration in time range)    Or  acetaminophen (TYLENOL) suppository 650 mg (has no administration in time range)  ondansetron (ZOFRAN) tablet 4 mg (has no administration in time range)    Or  ondansetron (ZOFRAN) injection 4 mg (has no administration in time range)  HYDROmorphone (DILAUDID) injection 1 mg (1 mg Intravenous Given 12/12/20 1432)  sodium chloride 0.9 % bolus 1,000 mL (0 mLs Intravenous Stopped 12/12/20 1612)  ondansetron (ZOFRAN) injection 4 mg (4 mg Intravenous Given 12/12/20 1432)  piperacillin-tazobactam (ZOSYN) IVPB 3.375 g (0 g Intravenous Stopped 12/12/20 1612)  iohexol (OMNIPAQUE) 300 MG/ML solution 100 mL (100 mLs Intravenous Contrast Given 12/12/20 1516)  0.9 %  sodium chloride infusion ( Intravenous New Bag/Given 12/12/20 1920)  HYDROmorphone (DILAUDID) injection 1 mg (1 mg Intravenous Given 12/12/20 1922)    ED Course  I have reviewed the triage vital signs and the nursing notes.  Pertinent labs & imaging results that were available during my care of the patient were reviewed by me and considered in my medical decision making (see chart for details).  This patient presents to the Emergency Department with complaint of abdominal pain. This involves an extensive number of treatment options,  and is a complaint that carries with it a high risk of complications and morbidity.  The differential diagnosis includes pancreatitis vs cholangitis vs hepatitis vs diverticulitis vs other  I ordered, reviewed, and interpreted labs, which were notable for Ast 79, ALT 202, Alk Phos 430, T Bili 5.5.  Lipase 1938.  WBC 13.7.   I ordered medication IV dilaudid, IV zofran, IV fluids, and IV zosyn for abdominal pain and/or nausea and intraabdominal infection coverage I ordered imaging studies which included CT abdomen/pelvis I independently visualized and interpreted imaging which showed pancreatitis with biliary dilation and the monitor tracing which showed NSR   I consulted GI and discussed lab and imaging findings  After the interventions stated above, I reevaluated the patient and found that they remained clinically stable - pain improved with IV medications.  Vitals wnl.   Clinical Course as of  12/12/20 2130  Fri Dec 12, 2020  1623 Possible pancreatic mass on CT causing biliary obstruction.  [facility] office contacted for consultation [MT]  1800 Awaiting call back from Dr Cristina Gong [MT]  1803 No call back - I messaged Dr Paulita Fujita who took over GI call at 5 pm [MT]  1901 Per consult note from GI team, plan for medical admission now, they will determine imaging for tomorrow [MT]    Clinical Course User Index [MT] Jermarion Poffenberger, Carola Rhine, MD    Final Clinical Impression(s) / ED Diagnoses Final diagnoses:  Biliary obstruction    Rx / DC Orders ED Discharge Orders    None       Wyvonnia Dusky, MD 12/12/20 2130

## 2020-12-12 NOTE — Progress Notes (Signed)
A consult was received from an ED physician for Zosyn per pharmacy dosing.  The patient's profile has been reviewed for ht/wt/allergies/indication/available labs.    A one time order has been placed for Zosyn 3.375g IV x 1 over 30 minutes.  Further antibiotics/pharmacy consults should be ordered by admitting physician if indicated.                       Thank you, Jamse Mead 12/12/2020  2:14 PM

## 2020-12-12 NOTE — H&P (Signed)
History and Physical    Blake Schmidt LZJ:[id number] DOB: 08-Jul-1956 DOA: 12/12/2020  PCP: Patient, No Pcp Per  Patient coming from: Home  I have personally briefly reviewed patient's old medical records in [facility]  Chief Complaint: Abd pain  HPI: Blake Schmidt is a 65 y.o. male with medical history significant of cholecystectomy in Oct 21.  Pt felt well for 2-3 weeks after cholecystectomy then started having intermittent RUQ abd pain.  Pain typically lasted 2-3 hours but then resolved.  Yesterday however pain became constant, severe, and didn't get any better.  Pt in to ED today.  Has nausea, no vomiting, no fevers/chills.   ED Course: Pt with pancreatitis, bile duct dilation.  Current suspicion is retained stone.  GI consulted and hospitalist asked to admit.   Review of Systems: As per HPI, otherwise all review of systems negative.  History reviewed. No pertinent past medical history.  Past Surgical History:  Procedure Laterality Date  . CHOLECYSTECTOMY    . WRIST SURGERY       reports that he has never smoked. He has never used smokeless tobacco. He reports that he does not drink alcohol and does not use drugs.  Allergies  Allergen Reactions  . Other     Pt states he does not want any blood products.PLEASE VERIFY THIS WITH PATIENT IF THE NEED FOR BLOOD ARISES. PT SLOW ANSWERING QUESTIONS.     No family history on file.   Prior to Admission medications   Not on File    Physical Exam: Vitals:   12/12/20 1700 12/12/20 1800 12/12/20 1900 12/12/20 2024  BP: (!) 115/48 124/86 119/73 (!) 152/83  Pulse: 77 80 75 74  Resp: 16 18 17 16   Temp:      TempSrc:      SpO2: 97% 98% 98% 100%  Weight:      Height:        Constitutional: NAD, calm, comfortable Eyes: PERRL, lids and conjunctivae icteric ENMT: Mucous membranes are moist. Posterior pharynx clear of any exudate or lesions.Normal dentition.  Neck: normal, supple, no masses, no  thyromegaly Respiratory: clear to auscultation bilaterally, no wheezing, no crackles. Normal respiratory effort. No accessory muscle use.  Cardiovascular: Regular rate and rhythm, no murmurs / rubs / gallops. No extremity edema. 2+ pedal pulses. No carotid bruits.  Abdomen: TTP with guarding. Musculoskeletal: no clubbing / cyanosis. No joint deformity upper and lower extremities. Good ROM, no contractures. Normal muscle tone.  Skin: no rashes, lesions, ulcers. No induration Neurologic: CN 2-12 grossly intact. Sensation intact, DTR normal. Strength 5/5 in all 4.  Psychiatric: Normal judgment and insight. Alert and oriented x 3. Normal mood.    Labs on Admission: I have personally reviewed following labs and imaging studies  CBC: Recent Labs  Lab 12/12/20 1225  WBC 13.7*  HGB 15.0  HCT 44.2  MCV 95.3  PLT 259   Basic Metabolic Panel: Recent Labs  Lab 12/12/20 1225  NA 138  K 4.0  CL 103  CO2 24  GLUCOSE 126*  BUN 20  CREATININE 1.08  CALCIUM 9.7   GFR: Estimated Creatinine Clearance: 70.9 mL/min (by C-G formula based on SCr of 1.08 mg/dL). Liver Function Tests: Recent Labs  Lab 12/12/20 1225  AST 79*  ALT 202*  ALKPHOS 430*  BILITOT 5.5*  PROT 8.3*  ALBUMIN 4.1   Recent Labs  Lab 12/12/20 1225  LIPASE 1,938*   No results for input(s): AMMONIA in the last 168 hours. Coagulation Profile:  No results for input(s): INR, PROTIME in the last 168 hours. Cardiac Enzymes: No results for input(s): CKTOTAL, CKMB, CKMBINDEX, TROPONINI in the last 168 hours. BNP (last 3 results) No results for input(s): PROBNP in the last 8760 hours. HbA1C: No results for input(s): HGBA1C in the last 72 hours. CBG: No results for input(s): GLUCAP in the last 168 hours. Lipid Profile: No results for input(s): CHOL, HDL, LDLCALC, TRIG, CHOLHDL, LDLDIRECT in the last 72 hours. Thyroid Function Tests: No results for input(s): TSH, T4TOTAL, FREET4, T3FREE, THYROIDAB in the last 72  hours. Anemia Panel: No results for input(s): VITAMINB12, FOLATE, FERRITIN, TIBC, IRON, RETICCTPCT in the last 72 hours. Urine analysis:    Component Value Date/Time   COLORURINE AMBER (A) 12/12/2020 1236   APPEARANCEUR CLEAR 12/12/2020 1236   LABSPEC 1.030 12/12/2020 1236   PHURINE 5.0 12/12/2020 1236   GLUCOSEU NEGATIVE 12/12/2020 1236   HGBUR NEGATIVE 12/12/2020 1236   BILIRUBINUR MODERATE (A) 12/12/2020 1236   KETONESUR NEGATIVE 12/12/2020 1236   PROTEINUR 30 (A) 12/12/2020 1236   UROBILINOGEN >=8.0 05/30/2020 1500   NITRITE NEGATIVE 12/12/2020 1236   LEUKOCYTESUR TRACE (A) 12/12/2020 1236    Radiological Exams on Admission: CT ABDOMEN PELVIS W CONTRAST  Result Date: 12/12/2020 CLINICAL DATA:  65 year old with abdominal pain. Fever and history of cholecystectomy 3 months ago. Pancreatitis and transaminitis. EXAM: CT ABDOMEN AND PELVIS WITH CONTRAST TECHNIQUE: Multidetector CT imaging of the abdomen and pelvis was performed using the standard protocol following bolus administration of intravenous contrast. CONTRAST:  100mL OMNIPAQUE IOHEXOL 300 MG/ML  SOLN COMPARISON:  None. FINDINGS: Lower chest: Tiny calcified granuloma in the left upper lobe on sequence 6, 6. Small patchy peripheral lung densities likely represent atelectasis or mild scarring. No large pleural effusions. Evidence for coronary artery calcifications. Hepatobiliary: Intrahepatic and extrahepatic biliary dilatation. Gallbladder has been removed. Tiny nodular density along the right inferior hepatic lobe on sequence 2 image 47 and sequence 4, image 93. Borders of this small structure are poorly defined and this nodule area measures 1.4 x 1.1 x 1.5 cm. No other suspicious hepatic lesions. Pancreas: Common bile duct is dilated down to the ampulla. Proximal common bile duct measures 1.5 cm. Distal common bile duct measures up to 1.3 cm on sequence 2 image 43. Dilatation of the proximal pancreatic duct measuring 0.7 cm on series  2, image 44. No significant peripancreatic inflammation. No evidence for pancreatic necrosis or pseudocyst formation. No obvious pancreatic mass or lesion. Fullness near the ampulla on sequence 2 image 47. Difficult to exclude an occult ampullary mass or stone in this location. Spleen: Normal in size without focal abnormality. Adrenals/Urinary Tract: Normal appearance of the adrenal glands. Nodular structure extending into the base of the posterior bladder. This likely represents an enlarged median lobe of the prostate. Small amount of fluid in the bladder and difficult to evaluate for bladder wall thickening. Negative for hydronephrosis. Normal appearance of both kidneys. No suspicious renal lesions. Stomach/Bowel: No inflammatory changes around the duodenum. Normal appearance of the stomach without dilatation. Scattered colonic diverticula without acute inflammatory changes. No acute inflammatory changes involving the appendix. Negative for bowel obstruction. Vascular/Lymphatic: Normal caliber of the abdominal aorta with minimal atherosclerotic disease. Main visceral arteries are patent. No lymph node enlargement in the abdomen or pelvis. Reproductive: Prostate is enlarged and there is nodular structure extending into the right posterior base of the bladder. This likely represents an enlarged median lobe. Prostate roughly measures 6.8 x 5.4 x 5.8 cm. Other: Question  a tiny left inguinal hernia containing fat. Negative for free air. Negative for free fluid. Musculoskeletal: No acute bone abnormality. IMPRESSION: 1. Moderate to severe intrahepatic and extrahepatic biliary dilatation without an obvious obstructing mass or stone. The biliary system is dilated all the way to the duodenum which raises concern for occult ampullary mass, radiolucent stone or stenosis. There is also dilatation of the main pancreatic duct. Recommend GI consultation with MRCP/ERCP. 2. No significant inflammatory changes around the pancreas  despite the markedly elevated lipase. No evidence for pancreatic necrosis or pseudocyst formations. 3. Indeterminate nodular density near the right inferior hepatic lobe with poorly defined borders and possibly mild surrounding inflammatory changes. Structure measures up to 1.5 cm. Based on the mild stranding in this area, suspect this could be postinflammatory and related to previous gallbladder surgery. Primary liver lesion is thought to be less likely but cannot be excluded and recommend follow-up. 4. Prostate hypertrophy with enlarged median lobe. Recommend correlation with PSA level. Electronically Signed   By: Richarda Overlie M.D.   On: 12/12/2020 16:10    EKG: Independently reviewed.  Assessment/Plan Principal Problem:   Acute biliary pancreatitis Active Problems:   Common bile duct (CBD) obstruction    1. Acute biliary pancreatitis - 1. Suspicion is retained stone causing 2. IVF 3. Pain control 4. NPO after MN 5. See GI consult note 6. Possible ERCP/MRCP tomorrow 7. Repeat CBC/CMP in AM.  DVT prophylaxis: SCDs Code Status: Full Family Communication: No family in room Disposition Plan: Home after pancreatitis improvement and likely ERCP Consults called: GI Admission status: Admit to inpatient  Severity of Illness: The appropriate patient status for this patient is INPATIENT. Inpatient status is judged to be reasonable and necessary in order to provide the required intensity of service to ensure the patient's safety. The patient's presenting symptoms, physical exam findings, and initial radiographic and laboratory data in the context of their chronic comorbidities is felt to place them at high risk for further clinical deterioration. Furthermore, it is not anticipated that the patient will be medically stable for discharge from the hospital within 2 midnights of admission. The following factors support the patient status of inpatient.   IP status due to acute pancreatitis with CBD  obstruction, likely need for ERCP.   * I certify that at the point of admission it is my clinical judgment that the patient will require inpatient hospital care spanning beyond 2 midnights from the point of admission due to high intensity of service, high risk for further deterioration and high frequency of surveillance required.*    Sabrena Gavitt M. DO [facility]  How to contact the [facility] Attending or Consulting provider 7A - 7P or covering provider during after hours 7P -7A, for this patient?  1. Check the care team in [place] and look for a) attending/consulting [facility] provider listed and b) the [facility] team listed 2. Log into [email or web address]  Amion Physician Scheduling and messaging for groups and whole hospitals  On call and physician scheduling software for group practices, residents, hospitalists and other medical providers for call, clinic, rotation and shift schedules. OnCall [employer] is a hospital-wide system for scheduling doctors and paging doctors on call. EasyPlot is for scientific plotting and data analysis.  [email or web address]  and use [place]'s universal password to access. If you do not have the password, please contact the hospital operator.  3. Locate the [facility] provider you are looking for under [facility] and page to a number that you can be directly reached.  4. If you still have difficulty reaching the provider, please page the [facility] (Director on Call) for the Hospitalists listed on amion for assistance.  12/12/2020, 8:30 PM

## 2020-12-12 NOTE — Progress Notes (Signed)
When doing nursing admission history, patient states he does not accept blood products. I asked him if he is a [place] witness and he states yes.So I asked him again if he accepts blood products and he states no.  I did the history and after doing so I asked him again if he accepts blood products and he states no. I asked his er rn to verify with him again about whether he takes blood products or not. Pt speaks english but seems slow to respond to some of the questions.  Briscoe Burns BSN, RN-BC Admissions RN 12/12/2020 7:52 PM

## 2020-12-13 LAB — COMPREHENSIVE METABOLIC PANEL
ALT: 117 U/L — ABNORMAL HIGH (ref 0–44)
AST: 39 U/L (ref 15–41)
Albumin: 3.1 g/dL — ABNORMAL LOW (ref 3.5–5.0)
Alkaline Phosphatase: 325 U/L — ABNORMAL HIGH (ref 38–126)
Anion gap: 9 (ref 5–15)
BUN: 17 mg/dL (ref 8–23)
CO2: 22 mmol/L (ref 22–32)
Calcium: 8.4 mg/dL — ABNORMAL LOW (ref 8.9–10.3)
Chloride: 107 mmol/L (ref 98–111)
Creatinine, Ser: 0.87 mg/dL (ref 0.61–1.24)
GFR, Estimated: >60 mL/min (ref >60)
Glucose, Bld: 96 mg/dL (ref 70–99)
Potassium: 3.6 mmol/L (ref 3.5–5.1)
Sodium: 138 mmol/L (ref 135–145)
Total Bilirubin: 4.7 mg/dL — ABNORMAL HIGH (ref 0.3–1.2)
Total Protein: 6.3 g/dL — ABNORMAL LOW (ref 6.5–8.1)

## 2020-12-13 LAB — CBC
HCT: 37.8 % — ABNORMAL LOW (ref 39.0–52.0)
Hemoglobin: 12.7 g/dL — ABNORMAL LOW (ref 13.0–17.0)
MCH: 32.5 pg (ref 26.0–34.0)
MCHC: 33.6 g/dL (ref 30.0–36.0)
MCV: 96.7 fL (ref 80.0–100.0)
Platelets: 220 10*3/uL (ref 150–400)
RBC: 3.91 MIL/uL — ABNORMAL LOW (ref 4.22–5.81)
RDW: 14.8 % (ref 11.5–15.5)
WBC: 9.8 10*3/uL (ref 4.0–10.5)
nRBC: 0 % (ref 0.0–0.2)

## 2020-12-13 LAB — HIV ANTIBODY (ROUTINE TESTING W REFLEX): HIV Screen 4th Generation wRfx: NONREACTIVE

## 2020-12-13 LAB — LIPASE, BLOOD: Lipase: 994 U/L — ABNORMAL HIGH (ref 11–51)

## 2020-12-13 NOTE — Progress Notes (Signed)
Subjective: Pain a little better.  Objective: Vital signs in last 24 hours: Temp:  [98.3 F (36.8 C)-99.2 F (37.3 C)] 98.5 F (36.9 C) (01/22 0535) Pulse Rate:  [67-95] 67 (01/22 0535) Resp:  [15-27] 15 (01/21 2030) BP: (95-152)/(48-86) 106/62 (01/22 0535) SpO2:  [92 %-100 %] 96 % (01/22 0535) Weight:  [88 kg] 88 kg (01/21 1411) Weight change:     PE: GEN:  NAD HEENT:  Jaundiced ABD:  Epigastric tenderness with voluntary guarding, no peritonitis  Lab Results: CBC    Component Value Date/Time   WBC 9.8 12/13/2020 0520   RBC 3.91 (L) 12/13/2020 0520   HGB 12.7 (L) 12/13/2020 0520   HCT 37.8 (L) 12/13/2020 0520   PLT 220 12/13/2020 0520   MCV 96.7 12/13/2020 0520   MCH 32.5 12/13/2020 0520   MCHC 33.6 12/13/2020 0520   RDW 14.8 12/13/2020 0520   LYMPHSABS 1.3 05/30/2020 1417   MONOABS 0.5 05/30/2020 1417   EOSABS 0.4 05/30/2020 1417   BASOSABS 0.0 05/30/2020 1417   CMP     Component Value Date/Time   NA 138 12/13/2020 0520   K 3.6 12/13/2020 0520   CL 107 12/13/2020 0520   CO2 22 12/13/2020 0520   GLUCOSE 96 12/13/2020 0520   BUN 17 12/13/2020 0520   CREATININE 0.87 12/13/2020 0520   CALCIUM 8.4 (L) 12/13/2020 0520   PROT 6.3 (L) 12/13/2020 0520   ALBUMIN 3.1 (L) 12/13/2020 0520   AST 39 12/13/2020 0520   ALT 117 (H) 12/13/2020 0520   ALKPHOS 325 (H) 12/13/2020 0520   BILITOT 4.7 (H) 12/13/2020 0520   GFRNONAA >60 12/13/2020 0520   GFRAA >60 05/30/2020 1417    Assessment:  1.  Pancreatitis with elevated LFTs. 2.  Prior cholecystectomy. 3.  Overall constellation of symptoms most consistent with either ball-valving or passed bile duct stone.  Malignancy not favored but not completely excluded.  Plan:  1.  Trial of clear liquids. 2.  Continued treatment of pancreatitis (lipase still nearly 1000), including IV fluids and analgesics. 3.  Do not think patient needs antibiotics, but if develops fever that could be considered. 4.  MRI/MRCP tomorrow.   Would not consider ERCP right yet in midst of ongoing (presumed) pancreatitis. 5.  [facility] will follow.   Blake Schmidt 12/13/2020, 11:00 AM   Cell [phone] If no answer or after 5 PM call [phone]

## 2020-12-13 NOTE — Progress Notes (Signed)
[facility] Progress Note  Patient: Blake Schmidt    PNT:[id number]  DOA: 12/12/2020     Date of Service: the patient was seen and examined on 12/13/2020  Brief hospital course: Past medical history of cholecystectomy 10/21.  Presents with some abdominal pain gallstone pancreatitis. Currently plan is pain control.  Assessment and Plan: 1.  Acute biliary pancreatitis GI consulted. Appreciated assistance. MRCP tomorrow. Currently on IV fluids. N.p.o. so far, clear liquid diet per GI. Continue pain control nausea control. No ERCP in the setting of acute pancreatitis for now per GI.  2.  LFT elevation Improving already. Likely in the setting of gallstone.  Monitor.  3.  Leukocytosis. Improving.  Monitor.  4.  BPH Seen on CT scan. Outpatient work-up recommended.  At risk for retention.  5.  Hepatic lesion 1.5 cm indeterminate nodular density in the right hepatic lobe. Etiology not clear. We will see how it appears on the MRI tomorrow.  6.  Obesity Placing the patient at high risk for poor outcomes. Body mass index is 31.31 kg/m.   Diet: Clear liquid diet DVT Prophylaxis:   SCDs Start: 12/12/20 1922    Advance goals of care discussion: Full code  Family Communication: no family was present at bedside, at the time of interview.   Disposition:  Status is: Inpatient  Remains inpatient appropriate because:IV treatments appropriate due to intensity of illness or inability to take PO   Dispo: The patient is from: Home              Anticipated d/c is to: Home              Anticipated d/c date is: 2 days              Patient currently is not medically stable to d/c.   Difficult to place patient No  Subjective: No nausea or vomiting.  No fever no chills.  No chest pain.  Continues to have abdominal pain.  Passing gas but no BM.  Physical Exam:  General: Appear in mild distress, no Rash; Oral Mucosa Clear, moist. no Abnormal Neck Mass Or lumps, Conjunctiva normal   Cardiovascular: S1 and S2 Present, no Murmur, Respiratory: good respiratory effort, Bilateral Air entry present and CTA, no Crackles, no wheezes Abdomen: Bowel Sound present, Soft and mild tenderness Extremities: no Pedal edema Neurology: alert and oriented to time, place, and person affect appropriate. no new focal deficit Gait not checked due to patient safety concerns  Vitals:   12/12/20 2024 12/12/20 2030 12/12/20 2108 12/13/20 0535  BP: (!) 152/83 95/63 128/72 106/62  Pulse: 74 74 75 67  Resp: 16 15    Temp:  99 F (37.2 C) 98.9 F (37.2 C) 98.5 F (36.9 C)  TempSrc:  Oral Oral Oral  SpO2: 100% 96% 96% 96%  Weight:      Height:        Intake/Output Summary (Last 24 hours) at 12/13/2020 1251 Last data filed at 12/13/2020 0809 Gross per 24 hour  Intake 3100 ml  Output 800 ml  Net 2300 ml   Filed Weights   12/12/20 1411  Weight: 88 kg    Data Reviewed: I have personally reviewed and interpreted daily labs, tele strips, imaging. I reviewed all nursing notes, pharmacy notes, vitals, pertinent old records I have discussed plan of care as described above with RN and patient/family.  CBC: Recent Labs  Lab 12/12/20 1225 12/13/20 0520  WBC 13.7* 9.8  HGB 15.0 12.7*  HCT  44.2 37.8*  MCV 95.3 96.7  PLT 259 220   Basic Metabolic Panel: Recent Labs  Lab 12/12/20 1225 12/13/20 0520  NA 138 138  K 4.0 3.6  CL 103 107  CO2 24 22  GLUCOSE 126* 96  BUN 20 17  CREATININE 1.08 0.87  CALCIUM 9.7 8.4*    Studies: CT ABDOMEN PELVIS W CONTRAST  Result Date: 12/12/2020 CLINICAL DATA:  65 year old with abdominal pain. Fever and history of cholecystectomy 3 months ago. Pancreatitis and transaminitis. EXAM: CT ABDOMEN AND PELVIS WITH CONTRAST TECHNIQUE: Multidetector CT imaging of the abdomen and pelvis was performed using the standard protocol following bolus administration of intravenous contrast. CONTRAST:  OMNIPAQUE IOHEXOL 300 MG/ML  SOLN COMPARISON:  None.  FINDINGS: Lower chest: Tiny calcified granuloma in the left upper lobe on sequence 6, 6. Small patchy peripheral lung densities likely represent atelectasis or mild scarring. No large pleural effusions. Evidence for coronary artery calcifications. Hepatobiliary: Intrahepatic and extrahepatic biliary dilatation. Gallbladder has been removed. Tiny nodular density along the right inferior hepatic lobe on sequence 2 image 47 and sequence 4, image 93. Borders of this small structure are poorly defined and this nodule area measures 1.4 x 1.1 x 1.5 cm. No other suspicious hepatic lesions. Pancreas: Common bile duct is dilated down to the ampulla. Proximal common bile duct measures 1.5 cm. Distal common bile duct measures up to 1.3 cm on sequence 2 image 43. Dilatation of the proximal pancreatic duct measuring 0.7 cm on series 2, image 44. No significant peripancreatic inflammation. No evidence for pancreatic necrosis or pseudocyst formation. No obvious pancreatic mass or lesion. Fullness near the ampulla on sequence 2 image 47. Difficult to exclude an occult ampullary mass or stone in this location. Spleen: Normal in size without focal abnormality. Adrenals/Urinary Tract: Normal appearance of the adrenal glands. Nodular structure extending into the base of the posterior bladder. This likely represents an enlarged median lobe of the prostate. Small amount of fluid in the bladder and difficult to evaluate for bladder wall thickening. Negative for hydronephrosis. Normal appearance of both kidneys. No suspicious renal lesions. Stomach/Bowel: No inflammatory changes around the duodenum. Normal appearance of the stomach without dilatation. Scattered colonic diverticula without acute inflammatory changes. No acute inflammatory changes involving the appendix. Negative for bowel obstruction. Vascular/Lymphatic: Normal caliber of the abdominal aorta with minimal atherosclerotic disease. Main visceral arteries are patent. No lymph  node enlargement in the abdomen or pelvis. Reproductive: Prostate is enlarged and there is nodular structure extending into the right posterior base of the bladder. This likely represents an enlarged median lobe. Prostate roughly measures 6.8 x 5.4 x 5.8 cm. Other: Question a tiny left inguinal hernia containing fat. Negative for free air. Negative for free fluid. Musculoskeletal: No acute bone abnormality. IMPRESSION: 1. Moderate to severe intrahepatic and extrahepatic biliary dilatation without an obvious obstructing mass or stone. The biliary system is dilated all the way to the duodenum which raises concern for occult ampullary mass, radiolucent stone or stenosis. There is also dilatation of the main pancreatic duct. Recommend GI consultation with MRCP/ERCP. 2. No significant inflammatory changes around the pancreas despite the markedly elevated lipase. No evidence for pancreatic necrosis or pseudocyst formations. 3. Indeterminate nodular density near the right inferior hepatic lobe with poorly defined borders and possibly mild surrounding inflammatory changes. Structure measures up to 1.5 cm. Based on the mild stranding in this area, suspect this could be postinflammatory and related to previous gallbladder surgery. Primary liver lesion is thought to  be less likely but cannot be excluded and recommend follow-up. 4. Prostate hypertrophy with enlarged median lobe. Recommend correlation with PSA level. Electronically Signed   By: Richarda Overlie M.D.   On: 12/12/2020 16:10    Scheduled Meds: Continuous Infusions: . sodium chloride 200 mL/hr at 12/13/20 0542   PRN Meds: acetaminophen **OR** acetaminophen, HYDROmorphone (DILAUDID) injection, ondansetron **OR** ondansetron (ZOFRAN) IV  Time spent: 35 minutes  Author: Lynden Oxford, MD [facility] 12/13/2020 12:51 PM  To reach On-call, see care teams to locate the attending and reach out via [email or web address]. Between 7PM-7AM, please contact  night-coverage If you still have difficulty reaching the attending provider, please page the [facility] (Director on Call) for [facility] on amion for assistance.

## 2020-12-14 LAB — CBC
HCT: 43.7 % (ref 39.0–52.0)
Hemoglobin: 14.5 g/dL (ref 13.0–17.0)
MCH: 32.4 pg (ref 26.0–34.0)
MCHC: 33.2 g/dL (ref 30.0–36.0)
MCV: 97.5 fL (ref 80.0–100.0)
Platelets: 240 10*3/uL (ref 150–400)
RBC: 4.48 MIL/uL (ref 4.22–5.81)
RDW: 14.5 % (ref 11.5–15.5)
WBC: 7.8 10*3/uL (ref 4.0–10.5)
nRBC: 0 % (ref 0.0–0.2)

## 2020-12-14 LAB — LIPASE, BLOOD: Lipase: 200 U/L — ABNORMAL HIGH (ref 11–51)

## 2020-12-14 LAB — COMPREHENSIVE METABOLIC PANEL
ALT: 93 U/L — ABNORMAL HIGH (ref 0–44)
AST: 23 U/L (ref 15–41)
Albumin: 3.5 g/dL (ref 3.5–5.0)
Alkaline Phosphatase: 338 U/L — ABNORMAL HIGH (ref 38–126)
Anion gap: 5 (ref 5–15)
BUN: 10 mg/dL (ref 8–23)
CO2: 27 mmol/L (ref 22–32)
Calcium: 9 mg/dL (ref 8.9–10.3)
Chloride: 106 mmol/L (ref 98–111)
Creatinine, Ser: 0.95 mg/dL (ref 0.61–1.24)
GFR, Estimated: >60 mL/min (ref >60)
Glucose, Bld: 97 mg/dL (ref 70–99)
Potassium: 3.6 mmol/L (ref 3.5–5.1)
Sodium: 138 mmol/L (ref 135–145)
Total Bilirubin: 2 mg/dL — ABNORMAL HIGH (ref 0.3–1.2)
Total Protein: 7.4 g/dL (ref 6.5–8.1)

## 2020-12-14 LAB — MAGNESIUM: Magnesium: 1.9 mg/dL (ref 1.7–2.4)

## 2020-12-14 NOTE — Progress Notes (Signed)
Subjective: Abdominal pain is improving (but not resolved).  Objective: Vital signs in last 24 hours: Temp:  [98.1 F (36.7 C)-98.8 F (37.1 C)] 98.1 F (36.7 C) (01/23 0551) Pulse Rate:  [57-63] 57 (01/23 0551) Resp:  [16-20] 16 (01/23 0551) BP: (105-116)/(60-70) 105/70 (01/23 0551) SpO2:  [94 %-96 %] 96 % (01/23 0551) Weight change:  Last BM Date: 12/14/20  PE: GEN:  NAD ABD:  Soft, mild epigastric and right upper quadrant tenderness, no peritonitis.  Lab Results: CBC    Component Value Date/Time   WBC 7.8 12/14/2020 0619   RBC 4.48 12/14/2020 0619   HGB 14.5 12/14/2020 0619   HCT 43.7 12/14/2020 0619   PLT 240 12/14/2020 0619   MCV 97.5 12/14/2020 0619   MCH 32.4 12/14/2020 0619   MCHC 33.2 12/14/2020 0619   RDW 14.5 12/14/2020 0619   LYMPHSABS 1.3 05/30/2020 1417   MONOABS 0.5 05/30/2020 1417   EOSABS 0.4 05/30/2020 1417   BASOSABS 0.0 05/30/2020 1417   CMP     Component Value Date/Time   NA 138 12/14/2020 0619   K 3.6 12/14/2020 0619   CL 106 12/14/2020 0619   CO2 27 12/14/2020 0619   GLUCOSE 97 12/14/2020 0619   BUN 10 12/14/2020 0619   CREATININE 0.95 12/14/2020 0619   CALCIUM 9.0 12/14/2020 0619   PROT 7.4 12/14/2020 0619   ALBUMIN 3.5 12/14/2020 0619   AST 23 12/14/2020 0619   ALT 93 (H) 12/14/2020 0619   ALKPHOS 338 (H) 12/14/2020 0619   BILITOT 2.0 (H) 12/14/2020 0619   GFRNONAA >60 12/14/2020 0619   GFRAA >60 05/30/2020 1417    Assessment:  1.  Pancreatitis with elevated LFTs. Pain improving and LFTs downtrending. 2.  Prior cholecystectomy. 3.  Overall constellation of symptoms most consistent with either ball-valving or passed bile duct stone.  Malignancy not favored but not completely excluded.  Plan:  1.  Follow LFTs. 2.  Supportive management for pancreatitis. 3.  MRCP has been ordered. 4.  Clear liquid diet only for now. 5.  [facility] will follow.   Freddy Jaksch 12/14/2020, 12:30 PM   Cell [phone] If no answer or  after 5 PM call [phone]

## 2020-12-14 NOTE — Progress Notes (Signed)
[facility] Progress Note  Patient: Blake Schmidt    GUY:[id number]  DOA: 12/12/2020     Date of Service: the patient was seen and examined on 12/14/2020  Brief hospital course: Past medical history of cholecystectomy 10/21.  Presents with some abdominal pain gallstone pancreatitis. Currently plan is pain control follow-up on the results of the MRCP  Assessment and Plan: 1.  Acute biliary pancreatitis GI consulted. Appreciated assistance. MRCP currently pending Currently on IV fluids. clear liquid diet per GI. Continue pain control nausea control. No ERCP in the setting of acute pancreatitis for now per GI.  2.  LFT elevation Improving already. Likely in the setting of gallstone.  Monitor.  3.  Leukocytosis. Improving.  Monitor.  4.  BPH Seen on CT scan. Outpatient work-up recommended.  At risk for retention.  5.  Hepatic lesion 1.5 cm indeterminate nodular density in the right hepatic lobe. Etiology not clear. We will see how it appears on the MRI tomorrow.  6.  Obesity Placing the patient at high risk for poor outcomes. Body mass index is 31.31 kg/m.   Diet: Clear liquid diet DVT Prophylaxis:   SCDs Start: 12/12/20 1922  Advance goals of care discussion: Full code  Family Communication: no family was present at bedside, at the time of interview.   Disposition:  Status is: Inpatient  Remains inpatient appropriate because:IV treatments appropriate due to intensity of illness or inability to take PO   Dispo: The patient is from: Home              Anticipated d/c is to: Home              Anticipated d/c date is: 2 days              Patient currently is not medically stable to d/c.   Difficult to place patient No  Subjective: No nausea or vomiting.  No fever no chills.  No chest pain.  Continues to have abdominal pain.  Passing gas but no BM.  Physical Exam: General: Appear in mild distress, no Rash; Oral Mucosa Clear, moist. no Abnormal Neck Mass Or  lumps, Conjunctiva normal  Cardiovascular: S1 and S2 Present, no Murmur, Respiratory: Normal respiratory effort, Bilateral Air entry present and no crackles, no wheezes Abdomen: Bowel Sound present, Soft and mild diffuse tenderness Extremities: trace Pedal edema Neurology: alert and oriented to time, place, and person affect appropriate. no new focal deficit Gait not checked due to patient safety concerns    Vitals:   12/13/20 1750 12/13/20 2008 12/14/20 0551 12/14/20 1335  BP: 115/61 116/60 105/70 119/62  Pulse: 62 63 (!) 57 (!) 58  Resp: 20 16 16 18   Temp: 98.7 F (37.1 C) 98.8 F (37.1 C) 98.1 F (36.7 C) 98.5 F (36.9 C)  TempSrc: Oral Oral Oral Oral  SpO2: 94% 95% 96% 96%  Weight:      Height:        Intake/Output Summary (Last 24 hours) at 12/14/2020 1339 Last data filed at 12/14/2020 12/16/2020 Gross per 24 hour  Intake 2760 ml  Output 3000 ml  Net -240 ml   Filed Weights   12/12/20 1411  Weight: 88 kg    Data Reviewed: I have personally reviewed and interpreted daily labs, tele strips, imaging. I reviewed all nursing notes, pharmacy notes, vitals, pertinent old records I have discussed plan of care as described above with RN and patient/family.  CBC: Recent Labs  Lab 12/12/20 1225 12/13/20 0520 12/14/20 12/16/20  WBC 13.7* 9.8 7.8  HGB 15.0 12.7* 14.5  HCT 44.2 37.8* 43.7  MCV 95.3 96.7 97.5  PLT 259 220 240   Basic Metabolic Panel: Recent Labs  Lab 12/12/20 1225 12/13/20 0520 12/14/20 0619  NA 138 138 138  K 4.0 3.6 3.6  CL 103 107 106  CO2 24 22 27   GLUCOSE 126* 96 97  BUN 20 17 10   CREATININE 1.08 0.87 0.95  CALCIUM 9.7 8.4* 9.0  MG  --   --  1.9    Studies: No results found.  Scheduled Meds: Continuous Infusions:  PRN Meds: acetaminophen **OR** acetaminophen, HYDROmorphone (DILAUDID) injection, ondansetron **OR** ondansetron (ZOFRAN) IV  Time spent: 35 minutes  Author: , MD [facility] 12/14/2020 1:39 PM  To reach  On-call, see care teams to locate the attending and reach out via [email or web address]. Between 7PM-7AM, please contact night-coverage If you still have difficulty reaching the attending provider, please page the [facility] (Director on Call) for [facility] on amion for assistance.

## 2020-12-14 NOTE — Plan of Care (Signed)
  Problem: Pain Managment: Goal: General experience of comfort will improve Outcome: Progressing   

## 2020-12-15 ENCOUNTER — Inpatient Hospital Stay (HOSPITAL_COMMUNITY): Payer: Medicare Other

## 2020-12-15 LAB — CBC
HCT: 41.4 % (ref 39.0–52.0)
Hemoglobin: 13.6 g/dL (ref 13.0–17.0)
MCH: 32 pg (ref 26.0–34.0)
MCHC: 32.9 g/dL (ref 30.0–36.0)
MCV: 97.4 fL (ref 80.0–100.0)
Platelets: 257 10*3/uL (ref 150–400)
RBC: 4.25 MIL/uL (ref 4.22–5.81)
RDW: 14.1 % (ref 11.5–15.5)
WBC: 6.5 10*3/uL (ref 4.0–10.5)
nRBC: 0 % (ref 0.0–0.2)

## 2020-12-15 LAB — COMPREHENSIVE METABOLIC PANEL
ALT: 63 U/L — ABNORMAL HIGH (ref 0–44)
AST: 18 U/L (ref 15–41)
Albumin: 3.2 g/dL — ABNORMAL LOW (ref 3.5–5.0)
Alkaline Phosphatase: 257 U/L — ABNORMAL HIGH (ref 38–126)
Anion gap: 10 (ref 5–15)
BUN: 12 mg/dL (ref 8–23)
CO2: 23 mmol/L (ref 22–32)
Calcium: 8.9 mg/dL (ref 8.9–10.3)
Chloride: 107 mmol/L (ref 98–111)
Creatinine, Ser: 0.81 mg/dL (ref 0.61–1.24)
GFR, Estimated: >60 mL/min (ref >60)
Glucose, Bld: 93 mg/dL (ref 70–99)
Potassium: 3.8 mmol/L (ref 3.5–5.1)
Sodium: 140 mmol/L (ref 135–145)
Total Bilirubin: 1.5 mg/dL — ABNORMAL HIGH (ref 0.3–1.2)
Total Protein: 6.8 g/dL (ref 6.5–8.1)

## 2020-12-15 LAB — LIPASE, BLOOD: Lipase: 93 U/L — ABNORMAL HIGH (ref 11–51)

## 2020-12-15 LAB — MAGNESIUM: Magnesium: 2 mg/dL (ref 1.7–2.4)

## 2020-12-15 MED ORDER — GADOBUTROL 1 MMOL/ML IV SOLN
9.0000 mL | Freq: Once | INTRAVENOUS | Status: AC | PRN
  Administered 2020-12-15: 9 mL via INTRAVENOUS

## 2020-12-15 NOTE — Care Management Important Message (Signed)
Medicare important message printed for Nora Clements, NCM to give to the patient. 

## 2020-12-15 NOTE — Progress Notes (Signed)
[facility] Progress Note  Blake Schmidt 65 y.o. 03-06-1956  CC:  Pancreatitis (gallstone)  Subjective: Patient reports feeling better today, though has continued RUQ and epigastric pain, which he rates as 4 of 10.  Reports a loose BM this morning.  Is tolerating clear liquid diet and is interested in advancement.  Denies nausea/vomiting.  ROS : Review of Systems  Gastrointestinal: Positive for abdominal pain. Negative for blood in stool, constipation, diarrhea, heartburn, melena, nausea and vomiting.    Objective: Vital signs in last 24 hours: Vitals:   12/14/20 2026 12/15/20 0513  BP: 124/72 111/61  Pulse: (!) 58 (!) 52  Resp: 16 16  Temp: 97.9 F (36.6 C) 98.3 F (36.8 C)  SpO2: 96% 96%    Physical Exam:  General:  Alert, cooperative, no distress, appears stated age  Head:  Normocephalic, without obvious abnormality, atraumatic  Eyes:  Anicteric sclera, EOMs intact  Lungs:   Clear to auscultation bilaterally, respirations unlabored  Heart:  Regular rate and rhythm, S1, S2 normal  Abdomen:   Soft, non-distended, mild RUQ tenderness, bowel sounds active all four quadrants,  no guarding or peritoneal signs  Extremities: Extremities normal, atraumatic, no  edema  Pulses: 2+ and symmetric    Lab Results: Recent Labs    12/14/20 0619 12/15/20 0629  NA 138 140  K 3.6 3.8  CL 106 107  CO2 27 23  GLUCOSE 97 93  BUN 10 12  CREATININE 0.95 0.81  CALCIUM 9.0 8.9  MG 1.9 2.0   Recent Labs    12/14/20 0619 12/15/20 0629  AST 23 18  ALT 93* 63*  ALKPHOS 338* 257*  BILITOT 2.0* 1.5*  PROT 7.4 6.8  ALBUMIN 3.5 3.2*   Recent Labs    12/14/20 0619 12/15/20 0629  WBC 7.8 6.5  HGB 14.5 13.6  HCT 43.7 41.4  MCV 97.5 97.4  PLT 240 257   No results for input(s): LABPROT, INR in the last 72 hours.   Assessment: Suspected gallstone pancreatitis: Improving mild acute pancreatitis and resolution of biliary dilation resolved MRCP 1/24.  No CBD stone or  abnormality visualized in ampulla on MRCP.  LFTs continue to trend down, consistent with passed CBD stone -Today, T. Bili 1.5/ AST 18/ ALT 63/ ALP 257 -Renal function remains normal: BUN 12/ Cr 0.81 -Lipase trending down, now 93 (as compared to 1938 on arrival) -Cholecystectomy 08/2020  Plan: Advance diet to low-fat diet.  If well-tolerated, plan for discharge tomorrow.  Continue to trend LFTs.  Continue supportive care.  [facility] will follow.  Edrick Kins PA-C 12/15/2020, 8:43 AM  Contact #  [phone]

## 2020-12-15 NOTE — Progress Notes (Signed)
PROGRESS NOTE  Blake Schmidt  DOB: 1956-01-25  PCP: Patient, No Pcp Per QAS:[id number]  DOA: 12/12/2020  LOS: 3 days   Chief Complaint  Patient presents with  . Abdominal Pain   Brief narrative: Blake Schmidt is a 65 y.o. male with PMH significant for laparoscopic cholecystectomy 10/21.   Patient presented to ED on 1/21 with abdominal pain.  Labs showed elevated lipase level 12/1998, elevated liver enzymes including ALP.  CT scan abdomen showed moderate to severe intrahepatic extrahepatic biliary dilatation without an obvious obstructing mass or stone.  Patient was admitted to hospital service for acute biliary pancreatitis.  GI consultation was obtained.  1/24, MRCP was obtained which showed improving mild acute pancreatitis without evidence of pancreatic mass, necrosis.  Resolution biliary ductal dilatation.  No evidence of choledocholithiasis or other obstruction.  Subjective: Patient was seen and examined this morning.  Pleasant elderly male.  Sitting at the edge of the bed.  Not in distress.  Assessment/Plan: Acute biliary pancreatitis Cholecystectomy status -Presented with worsening abdominal pain, elevated liver enzymes, elevated lipase in the setting of cholecystectomy 3 months ago.   -Initial CT scan on admission with moderate to severe intrahepatic and extremity biliary dilation.   -1/24, MRCP showed resolution of ductal dilatation.   -Patient probably had a CBD stone which he passed by now.   -No need of procedure at this time.  Liver enzymes and lipase level improving as well.   -Diet advanced per GI.   -Continue to trend liver enzyme and lipase level. Recent Labs  Lab 12/12/20 1225 12/13/20 0520 12/14/20 0619 12/15/20 0629  AST 79* 39 23 18  ALT 202* 117* 93* 63*  ALKPHOS 430* 325* 338* 257*  BILITOT 5.5* 4.7* 2.0* 1.5*  PROT 8.3* 6.3* 7.4 6.8  ALBUMIN 4.1 3.1* 3.5 3.2*   Recent Labs  Lab 12/12/20 1225 12/13/20 0520 12/14/20 0619 12/15/20 0629  LIPASE  1,938* 994* 200* 93*   Mobility: Encourage ambulation Code Status:   Code Status: Full Code  Nutritional status: Body mass index is 31.31 kg/m.     Diet Order            Diet Heart Room service appropriate? Yes; Fluid consistency: Thin  Diet effective now                 DVT prophylaxis: SCDs Start: 12/12/20 1922   Antimicrobials:  None Fluid: None Consultants: GI Family Communication:  None at bedside  Status is: Inpatient  Dispo: The patient is from: Home              Anticipated d/c is to: Home              Anticipated d/c date is: 1 day              Patient currently is not medically stable to d/c.   Difficult to place patient No       Infusions:    Scheduled Meds:   Antimicrobials: Anti-infectives (From admission, onward)   Start     Dose/Rate Route Frequency Ordered Stop   12/12/20 1415  piperacillin-tazobactam (ZOSYN) IVPB 3.375 g        3.375 g 100 mL/hr over 30 Minutes Intravenous  Once 12/12/20 1414 12/12/20 1612      PRN meds: acetaminophen **OR** acetaminophen, HYDROmorphone (DILAUDID) injection, ondansetron **OR** ondansetron (ZOFRAN) IV   Objective: Vitals:   12/15/20 0513 12/15/20 1316  BP: 111/61 107/69  Pulse: (!) 52 63  Resp: 16 16  Temp: 98.3 F (36.8 C) 98.2 F (36.8 C)  SpO2: 96% 99%    Intake/Output Summary (Last 24 hours) at 12/15/2020 1417 Last data filed at 12/15/2020 0515 Gross per 24 hour  Intake 360 ml  Output 2850 ml  Net -2490 ml   Filed Weights   12/12/20 1411  Weight: 88 kg   Weight change:  Body mass index is 31.31 kg/m.   Physical Exam: General exam: Pleasant, elderly Caucasian male.  Not in distress Skin: No rashes, lesions or ulcers. HEENT: Atraumatic, normocephalic, no obvious bleeding Lungs: Clear to auscultation bilaterally CVS: Regular rate and rhythm, no murmur GI/Abd soft, nontender, nondistended, bowel sound present CNS: Alert, awake, oriented x3 Psychiatry: Mood  appropriate Extremities: No pedal edema, no calf tenderness  Data Review: I have personally reviewed the laboratory data and studies available.  Recent Labs  Lab 12/12/20 1225 12/13/20 0520 12/14/20 0619 12/15/20 0629  WBC 13.7* 9.8 7.8 6.5  HGB 15.0 12.7* 14.5 13.6  HCT 44.2 37.8* 43.7 41.4  MCV 95.3 96.7 97.5 97.4  PLT 259 220 240 257   Recent Labs  Lab 12/12/20 1225 12/13/20 0520 12/14/20 0619 12/15/20 0629  NA 138 138 138 140  K 4.0 3.6 3.6 3.8  CL 103 107 106 107  CO2 24 22 27 23   GLUCOSE 126* 96 97 93  BUN 20 17 10 12   CREATININE 1.08 0.87 0.95 0.81  CALCIUM 9.7 8.4* 9.0 8.9  MG  --   --  1.9 2.0    F/u labs ordered  Signed, , MD [facility] 12/15/2020

## 2020-12-16 LAB — COMPREHENSIVE METABOLIC PANEL
ALT: 63 U/L — ABNORMAL HIGH (ref 0–44)
AST: 26 U/L (ref 15–41)
Albumin: 3.5 g/dL (ref 3.5–5.0)
Alkaline Phosphatase: 249 U/L — ABNORMAL HIGH (ref 38–126)
Anion gap: 9 (ref 5–15)
BUN: 17 mg/dL (ref 8–23)
CO2: 26 mmol/L (ref 22–32)
Calcium: 9.2 mg/dL (ref 8.9–10.3)
Chloride: 105 mmol/L (ref 98–111)
Creatinine, Ser: 1 mg/dL (ref 0.61–1.24)
GFR, Estimated: >60 mL/min (ref >60)
Glucose, Bld: 103 mg/dL — ABNORMAL HIGH (ref 70–99)
Potassium: 4 mmol/L (ref 3.5–5.1)
Sodium: 140 mmol/L (ref 135–145)
Total Bilirubin: 1.4 mg/dL — ABNORMAL HIGH (ref 0.3–1.2)
Total Protein: 7.4 g/dL (ref 6.5–8.1)

## 2020-12-16 LAB — CBC
HCT: 44.1 % (ref 39.0–52.0)
Hemoglobin: 14.7 g/dL (ref 13.0–17.0)
MCH: 32 pg (ref 26.0–34.0)
MCHC: 33.3 g/dL (ref 30.0–36.0)
MCV: 95.9 fL (ref 80.0–100.0)
Platelets: 290 10*3/uL (ref 150–400)
RBC: 4.6 MIL/uL (ref 4.22–5.81)
RDW: 13.6 % (ref 11.5–15.5)
WBC: 6.3 10*3/uL (ref 4.0–10.5)
nRBC: 0 % (ref 0.0–0.2)

## 2020-12-16 LAB — MAGNESIUM: Magnesium: 2 mg/dL (ref 1.7–2.4)

## 2020-12-16 LAB — LIPASE, BLOOD: Lipase: 97 U/L — ABNORMAL HIGH (ref 11–51)

## 2020-12-16 NOTE — Discharge Summary (Signed)
Physician Discharge Summary  [place] YQI:[id number] DOB: 09/24/1956 DOA: 12/12/2020  PCP: Patient, No Pcp Per  Admit date: 12/12/2020 Discharge date: 12/16/2020  Admitted From: Home Discharge disposition: Home   Code Status: Full Code  Diet Recommendation: Low-fat diet for next few days.  Advance to regular as tolerated.  Discharge Diagnosis:   Principal Problem:   Acute biliary pancreatitis Active Problems:   Common bile duct (CBD) obstruction  Chief Complaint  Patient presents with  . Abdominal Pain   Brief narrative: Blake Schmidt is a 65 y.o. male with PMH significant for laparoscopic cholecystectomy 10/21.   Patient presented to ED on 1/21 with abdominal pain.  Labs showed elevated lipase level 12/1998, elevated liver enzymes including ALP.  CT scan abdomen showed moderate to severe intrahepatic extrahepatic biliary dilatation without an obvious obstructing mass or stone.  Patient was admitted to hospital service for acute biliary pancreatitis.  GI consultation was obtained.  1/24, MRCP was obtained which showed improving mild acute pancreatitis without evidence of pancreatic mass, necrosis.  Resolution biliary ductal dilatation.  No evidence of choledocholithiasis or other obstruction.  Subjective: Patient was seen and examined this morning.  Pleasant elderly male.   Getting ready for discharge.  Able to tolerate soft diet.  Liver enzymes and lipase level improving.    Hospital course: Acute biliary pancreatitis Cholecystectomy status -Presented with worsening abdominal pain, elevated liver enzymes, elevated lipase in the setting of cholecystectomy 3 months ago.   -Initial CT scan on admission with moderate to severe intrahepatic and extremity biliary dilation.   -1/24, MRCP showed resolution of ductal dilatation.   -Patient probably had a CBD stone which he passed by now.   -GI consult appreciated.  No need of procedure at this time.  Liver enzymes and lipase  level improving as well.   -Currently tolerating low-fat diet.  Continue same at home.  Advance as tolerated. Recent Labs  Lab 12/12/20 1225 12/13/20 0520 12/14/20 0619 12/15/20 0629 12/16/20 0659  AST 79* 39 23 18 26   ALT 202* 117* 93* 63* 63*  ALKPHOS 430* 325* 338* 257* 249*  BILITOT 5.5* 4.7* 2.0* 1.5* 1.4*  PROT 8.3* 6.3* 7.4 6.8 7.4  ALBUMIN 4.1 3.1* 3.5 3.2* 3.5   Recent Labs  Lab 12/12/20 1225 12/13/20 0520 12/14/20 0619 12/15/20 0629 12/16/20 0659  LIPASE 1,938* 994* 200* 93* 97*    Stable for discharge to home today.  Wound care:    Discharge Exam:   Vitals:   12/15/20 0513 12/15/20 1316 12/15/20 2037 12/16/20 0644  BP: 111/61 107/69 117/72 109/73  Pulse: (!) 52 63 (!) 56 (!) 59  Resp: 16 16 16 16   Temp: 98.3 F (36.8 C) 98.2 F (36.8 C) 98.1 F (36.7 C) 98.2 F (36.8 C)  TempSrc: Oral Oral Oral Oral  SpO2: 96% 99% 97% 98%  Weight:      Height:        Body mass index is 31.31 kg/m.  General exam: Pleasant, not in distress.  No new symptoms. Skin: No rashes, lesions or ulcers. HEENT: Atraumatic, normocephalic, no obvious bleeding Lungs: Clear to auscultation bilaterally CVS: Regular rate and rhythm, no murmur GI/Abd soft, nontender, nondistended, bowel sound present CNS: Alert, awake, oriented x3 Psychiatry: Mood appropriate Extremities: No pedal edema, no calf tenderness  Follow ups:   Discharge Instructions    Increase activity slowly   Complete by: As directed       Follow-up Information     [facility] Follow up.   Contact information: [address] [place] [id number] [phone]              Recommendations for Outpatient Follow-Up:   1. Follow-up with PCP next few days.  If no PCP, follow-up with community care clinic.  Discharge Instructions:  Follow with Primary MD Patient, No Pcp Per in 7 days   Get CBC/CMP/lipase checked in next visit within 1 week by PCP  or SNF MD ( we routinely change or add medications that can affect your baseline labs and fluid status, therefore we recommend that you get the mentioned basic workup next visit with your PCP, your PCP may decide not to get them or add new tests based on their clinical decision)  On your next visit with your PCP, please Get Medicines reviewed and adjusted.  Please request your PCP  to go over all Hospital Tests and Procedure/Radiological results at the follow up, please get all Hospital records sent to your Prim MD by signing hospital release before you go home.  Activity: As tolerated with Full fall precautions use walker/cane & assistance as needed  For Heart failure patients - Check your Weight same time everyday, if you gain over 2 pounds, or you develop in leg swelling, experience more shortness of breath or chest pain, call your Primary MD immediately. Follow Cardiac Low Salt Diet and 1.5 lit/day fluid restriction.  If you have smoked or chewed Tobacco in the last 2 yrs please stop smoking, stop any regular Alcohol  and or any Recreational drug use.  If you experience worsening of your admission symptoms, develop shortness of breath, life threatening emergency, suicidal or homicidal thoughts you must seek medical attention immediately by calling 911 or calling your MD immediately  if symptoms less severe.  You Must read complete instructions/literature along with all the possible adverse reactions/side effects for all the Medicines you take and that have been prescribed to you. Take any new Medicines after you have completely understood and accpet all the possible adverse reactions/side effects.   Do not drive, operate heavy machinery, perform activities at heights, swimming or participation in water activities or provide baby sitting services if your were admitted for syncope or siezures until you have seen by Primary MD or a Neurologist and advised to do so again.  Do not drive when taking  Pain medications.  Do not take more than prescribed Pain, Sleep and Anxiety Medications  Wear Seat belts while driving.   Please note You were cared for by a hospitalist during your hospital stay. If you have any questions about your discharge medications or the care you received while you were in the hospital after you are discharged, you can call the unit and asked to speak with the hospitalist on call if the hospitalist that took care of you is not available. Once you are discharged, your primary care physician will handle any further medical issues. Please note that NO REFILLS for any discharge medications will be authorized once you are discharged, as it is imperative that you return to your primary care physician (or establish a relationship with a primary care physician if you do not have one) for your aftercare needs so that they can reassess your need for medications and monitor your lab values.    Allergies as of 12/16/2020      Reactions   Other    Pt states he does not want any blood products.PLEASE VERIFY THIS WITH PATIENT IF THE  NEED FOR BLOOD ARISES. PT SLOW ANSWERING QUESTIONS.       Medication List    You have not been prescribed any medications.     Time coordinating discharge: 35 minutes  The results of significant diagnostics from this hospitalization (including imaging, microbiology, ancillary and laboratory) are listed below for reference.    Procedures and Diagnostic Studies:   CT ABDOMEN PELVIS W CONTRAST  Result Date: 12/12/2020 CLINICAL DATA:  65 year old with abdominal pain. Fever and history of cholecystectomy 3 months ago. Pancreatitis and transaminitis. EXAM: CT ABDOMEN AND PELVIS WITH CONTRAST TECHNIQUE: Multidetector CT imaging of the abdomen and pelvis was performed using the standard protocol following bolus administration of intravenous contrast. CONTRAST:  OMNIPAQUE IOHEXOL 300 MG/ML  SOLN COMPARISON:  None. FINDINGS: Lower chest: Tiny calcified  granuloma in the left upper lobe on sequence 6, 6. Small patchy peripheral lung densities likely represent atelectasis or mild scarring. No large pleural effusions. Evidence for coronary artery calcifications. Hepatobiliary: Intrahepatic and extrahepatic biliary dilatation. Gallbladder has been removed. Tiny nodular density along the right inferior hepatic lobe on sequence 2 image 47 and sequence 4, image 93. Borders of this small structure are poorly defined and this nodule area measures 1.4 x 1.1 x 1.5 cm. No other suspicious hepatic lesions. Pancreas: Common bile duct is dilated down to the ampulla. Proximal common bile duct measures 1.5 cm. Distal common bile duct measures up to 1.3 cm on sequence 2 image 43. Dilatation of the proximal pancreatic duct measuring 0.7 cm on series 2, image 44. No significant peripancreatic inflammation. No evidence for pancreatic necrosis or pseudocyst formation. No obvious pancreatic mass or lesion. Fullness near the ampulla on sequence 2 image 47. Difficult to exclude an occult ampullary mass or stone in this location. Spleen: Normal in size without focal abnormality. Adrenals/Urinary Tract: Normal appearance of the adrenal glands. Nodular structure extending into the base of the posterior bladder. This likely represents an enlarged median lobe of the prostate. Small amount of fluid in the bladder and difficult to evaluate for bladder wall thickening. Negative for hydronephrosis. Normal appearance of both kidneys. No suspicious renal lesions. Stomach/Bowel: No inflammatory changes around the duodenum. Normal appearance of the stomach without dilatation. Scattered colonic diverticula without acute inflammatory changes. No acute inflammatory changes involving the appendix. Negative for bowel obstruction. Vascular/Lymphatic: Normal caliber of the abdominal aorta with minimal atherosclerotic disease. Main visceral arteries are patent. No lymph node enlargement in the abdomen or  pelvis. Reproductive: Prostate is enlarged and there is nodular structure extending into the right posterior base of the bladder. This likely represents an enlarged median lobe. Prostate roughly measures 6.8 x 5.4 x 5.8 cm. Other: Question a tiny left inguinal hernia containing fat. Negative for free air. Negative for free fluid. Musculoskeletal: No acute bone abnormality. IMPRESSION: 1. Moderate to severe intrahepatic and extrahepatic biliary dilatation without an obvious obstructing mass or stone. The biliary system is dilated all the way to the duodenum which raises concern for occult ampullary mass, radiolucent stone or stenosis. There is also dilatation of the main pancreatic duct. Recommend GI consultation with MRCP/ERCP. 2. No significant inflammatory changes around the pancreas despite the markedly elevated lipase. No evidence for pancreatic necrosis or pseudocyst formations. 3. Indeterminate nodular density near the right inferior hepatic lobe with poorly defined borders and possibly mild surrounding inflammatory changes. Structure measures up to 1.5 cm. Based on the mild stranding in this area, suspect this could be postinflammatory and related to previous gallbladder surgery. Primary  liver lesion is thought to be less likely but cannot be excluded and recommend follow-up. 4. Prostate hypertrophy with enlarged median lobe. Recommend correlation with PSA level. Electronically Signed   By: Richarda Overlie M.D.   On: 12/12/2020 16:10     Labs:   Basic Metabolic Panel: Recent Labs  Lab 12/12/20 1225 12/13/20 0520 12/14/20 0619 12/15/20 0629 12/16/20 0659  NA 138 138 138 140 140  K 4.0 3.6 3.6 3.8 4.0  CL 103 107 106 107 105  CO2 24 22 27 23 26   GLUCOSE 126* 96 97 93 103*  BUN 20 17 10 12 17   CREATININE 1.08 0.87 0.95 0.81 1.00  CALCIUM 9.7 8.4* 9.0 8.9 9.2  MG  --   --  1.9 2.0 2.0   GFR Estimated Creatinine Clearance: 76.6 mL/min (by C-G formula based on SCr of 1 mg/dL). Liver Function  Tests: Recent Labs  Lab 12/12/20 1225 12/13/20 0520 12/14/20 0619 12/15/20 0629 12/16/20 0659  AST 79* 39 23 18 26   ALT 202* 117* 93* 63* 63*  ALKPHOS 430* 325* 338* 257* 249*  BILITOT 5.5* 4.7* 2.0* 1.5* 1.4*  PROT 8.3* 6.3* 7.4 6.8 7.4  ALBUMIN 4.1 3.1* 3.5 3.2* 3.5   Recent Labs  Lab 12/12/20 1225 12/13/20 0520 12/14/20 0619 12/15/20 0629 12/16/20 0659  LIPASE 1,938* 994* 200* 93* 97*   No results for input(s): AMMONIA in the last 168 hours. Coagulation profile No results for input(s): INR, PROTIME in the last 168 hours.  CBC: Recent Labs  Lab 12/12/20 1225 12/13/20 0520 12/14/20 0619 12/15/20 0629 12/16/20 0659  WBC 13.7* 9.8 7.8 6.5 6.3  HGB 15.0 12.7* 14.5 13.6 14.7  HCT 44.2 37.8* 43.7 41.4 44.1  MCV 95.3 96.7 97.5 97.4 95.9  PLT 259 220 240 257 290   Cardiac Enzymes: No results for input(s): CKTOTAL, CKMB, CKMBINDEX, TROPONINI in the last 168 hours. BNP: Invalid input(s): POCBNP CBG: No results for input(s): GLUCAP in the last 168 hours. D-Dimer No results for input(s): DDIMER in the last 72 hours. Hgb A1c No results for input(s): HGBA1C in the last 72 hours. Lipid Profile No results for input(s): CHOL, HDL, LDLCALC, TRIG, CHOLHDL, LDLDIRECT in the last 72 hours. Thyroid function studies No results for input(s): TSH, T4TOTAL, T3FREE, THYROIDAB in the last 72 hours.  Invalid input(s): FREET3 Anemia work up No results for input(s): VITAMINB12, FOLATE, FERRITIN, TIBC, IRON, RETICCTPCT in the last 72 hours. Microbiology Recent Results (from the past 240 hour(s))  SARS Coronavirus 2 by RT PCR (hospital order, performed in [facility] hospital lab) Nasopharyngeal Nasopharyngeal Swab     Status: None   Collection Time: 12/12/20  4:48 PM   Specimen: Nasopharyngeal Swab  Result Value Ref Range Status   SARS Coronavirus 2 NEGATIVE NEGATIVE Final    Comment: (NOTE) SARS-CoV-2 target nucleic acids are NOT DETECTED.  The SARS-CoV-2 RNA is generally  detectable in upper and lower respiratory specimens during the acute phase of infection. The lowest concentration of SARS-CoV-2 viral copies this assay can detect is 250 copies / mL. A negative result does not preclude SARS-CoV-2 infection and should not be used as the sole basis for treatment or other patient management decisions.  A negative result may occur with improper specimen collection / handling, submission of specimen other than nasopharyngeal swab, presence of viral mutation(s) within the areas targeted by this assay, and inadequate number of viral copies (<250 copies / mL). A negative result must be combined with clinical observations, patient history,  and epidemiological information.  Fact Sheet for Patients:   BoilerBrush.com.cyhttps://www.fda.gov/media/136312/download  Fact Sheet for Healthcare Providers: https://pope.com/https://www.fda.gov/media/136313/download  This test is not yet approved or  cleared by the [place] FDA and has been authorized for detection and/or diagnosis of SARS-CoV-2 by FDA under an Emergency Use Authorization (EUA).  This EUA will remain in effect (meaning this test can be used) for the duration of the COVID-19 declaration under Section 564(b)(1) of the Act, 21 U.S.C. section 360bbb-3(b)(1), unless the authorization is terminated or revoked sooner.  Performed at [facility], [address] [place], [place] [id number]      Signed: Melina SchoolsBinaya Janeese Mcgloin  [facility] 12/16/2020, 10:06 AM

## 2020-12-16 NOTE — Progress Notes (Signed)
[facility] Progress Note  Blake Schmidt 65 y.o. 1956-05-30  CC:  Gallstone pancreatitis, suspect passed CBD stone  Subjective: Patient reports feeling better today.  Pain is a 2-3 of 10.  He tolerated dinner last night and breakfast this morning, though states he did not eat as much as he normally would.  However, he did not have any worsening pain with eating.  Denies any nausea or vomiting.  Reports last bowel movement was Sunday.    ROS : Review of Systems  Cardiovascular: Negative for chest pain and palpitations.  Gastrointestinal: Positive for abdominal pain. Negative for blood in stool, constipation, diarrhea, heartburn, melena, nausea and vomiting.    Objective: Vital signs in last 24 hours: Vitals:   12/15/20 2037 12/16/20 0644  BP: 117/72 109/73  Pulse: (!) 56 (!) 59  Resp: 16 16  Temp: 98.1 F (36.7 C) 98.2 F (36.8 C)  SpO2: 97% 98%    Physical Exam:  General:  Alert, oriented, cooperative, no distress  Head:  Normocephalic, without obvious abnormality, atraumatic  Eyes:  Anicteric sclera, EOMs intact  Lungs:   Clear to auscultation bilaterally, respirations unlabored  Heart:  Regular rate and rhythm, S1, S2 normal  Abdomen:   Soft, non-distended, mild RUQ and epigastric tenderness to palpation, bowel sounds active all four quadrants,  no guarding or peritoneal signs  Extremities: Extremities normal, atraumatic, no  edema  Pulses: 2+ and symmetric    Lab Results: Recent Labs    12/15/20 0629 12/16/20 0659  NA 140 140  K 3.8 4.0  CL 107 105  CO2 23 26  GLUCOSE 93 103*  BUN 12 17  CREATININE 0.81 1.00  CALCIUM 8.9 9.2  MG 2.0 2.0   Recent Labs    12/15/20 0629 12/16/20 0659  AST 18 26  ALT 63* 63*  ALKPHOS 257* 249*  BILITOT 1.5* 1.4*  PROT 6.8 7.4  ALBUMIN 3.2* 3.5   Recent Labs    12/15/20 0629 12/16/20 0659  WBC 6.5 6.3  HGB 13.6 14.7  HCT 41.4 44.1  MCV 97.4 95.9  PLT 257 290   No results for input(s): LABPROT, INR in  the last 72 hours.    Assessment: Suspected gallstone pancreatitis: Improving mild acute pancreatitis and resolution of biliary dilation resolved MRCP 1/24.  No CBD stone or abnormality visualized in ampulla on MRCP.  LFTs continue to trend down, consistent with passed CBD stone -Today, T. Bili 1.4/ AST 26/ ALT 63/ ALP 249 -Renal function remains normal -Cholecystectomy 08/2020  Plan: Okay to discharge from a GI standpoint.  We will arrange follow-up in our office to recheck LFTs and also discuss screening colonoscopy.  Contact information listed in AVS if patient would like to contact us sooner.  Low-fat diet and alcohol avoidance discussed with the patient.  He verbalized understanding.  [facility] will sign off.  Please contact us if we can be of any further assistance during his hospital stay.  Edrick Kins PA-C 12/16/2020, 10:30 AM  Contact #  [phone]

## 2020-12-16 NOTE — Discharge Instructions (Signed)
Acute Pancreatitis  Acute pancreatitis happens when the pancreas gets swollen. The pancreas is a large gland in the body that helps to control blood sugar. It also makes enzymes that help to digest food. This condition can last a few days and cause serious problems. The lungs, heart, and kidneys may stop working. What are the causes? Causes include:  Alcohol abuse.  Drug abuse.  Gallstones.  A tumor in the pancreas. Other causes include:  Some medicines.  Some chemicals.  Diabetes.  An infection.  Damage caused by an accident.  The poison (venom) from a scorpion bite.  Belly (abdominal) surgery.  The body's defense system (immune system) attacking the pancreas (autoimmune pancreatitis).  Genes that are passed from parent to child (inherited). In some cases, the cause is not known. What are the signs or symptoms?  Pain in the upper belly that may be felt in the back. The pain may be very bad.  Swelling of the belly.  Feeling sick to your stomach (nauseous) and throwing up (vomiting).  Fever. How is this treated? You will likely have to stay in the hospital. Treatment may include:  Pain medicine.  Fluid through an IV tube.  Placing a tube in the stomach to take out the stomach contents. This may help you stop throwing up.  Not eating for 3-4 days.  Antibiotic medicines, if you have an infection.  Treating any other problems that may be the cause.  Steroid medicines, if your problem is caused by your defense system attacking your body's own tissues.  Surgery. Follow these instructions at home: Eating and drinking  Follow instructions from your doctor about what to eat and drink.  Eat foods that do not have a lot of fat in them.  Eat small meals often. Do not eat big meals.  Drink enough fluid to keep your pee (urine) pale yellow.  Do not drink alcohol if it caused your condition.   Medicines  Take over-the-counter and prescription medicines only  as told by your doctor.  Ask your doctor if the medicine prescribed to you: ? Requires you to avoid driving or using heavy machinery. ? Can cause trouble pooping (constipation). You may need to take steps to prevent or treat trouble pooping:  Take over-the-counter or prescription medicines.  Eat foods that are high in fiber. These include beans, whole grains, and fresh fruits and vegetables.  Limit foods that are high in fat and sugar. These include fried or sweet foods. General instructions  Do not use any products that contain nicotine or tobacco, such as cigarettes, e-cigarettes, and chewing tobacco. If you need help quitting, ask your doctor.  Get plenty of rest.  Check your blood sugar at home as told by your doctor.  Keep all follow-up visits as told by your doctor. This is important. Contact a doctor if:  You do not get better as quickly as expected.  You have new symptoms.  Your symptoms get worse.  You have pain or weakness that lasts a long time.  You keep feeling sick to your stomach.  You get better and then you have pain again.  You have a fever. Get help right away if:  You cannot eat or keep fluids down.  Your pain gets very bad.  Your skin or the white part of your eyes turns yellow.  You have sudden swelling in your belly.  You throw up.  You feel dizzy or you pass out (faint).  Your blood sugar is high (over   300 mg/dL). Summary  Acute pancreatitis happens when the pancreas gets swollen.  This condition is often caused by alcohol abuse, drug abuse, or gallstones.  You will likely have to stay in the hospital for treatment. This information is not intended to replace advice given to you by your health care provider. Make sure you discuss any questions you have with your health care provider. Document Revised: 08/28/2018 Document Reviewed: 08/28/2018 Elsevier Patient Education  2021 [employer]  

## 2021-09-19 IMAGING — MR MR ABDOMEN WO/W CM MRCP
19 of 21 series · 43 of 48 positions shown · IV contrast (gadavist)
Comparison: CT on 12/12/2010

CLINICAL DATA: Intermittent right upper quadrant pain and jaundice.
Pancreatitis and elevated liver function tests. Approximately 3
months status post cholecystectomy.

EXAM:
MRI ABDOMEN WITHOUT AND WITH CONTRAST (INCLUDING MRCP)
TECHNIQUE: Multiplanar multisequence MR imaging of the abdomen was performed
both before and after the administration of intravenous contrast.
Heavily T2-weighted images of the biliary and pancreatic ducts were
obtained, and three-dimensional MRCP images were rendered by post
processing.
CONTRAST:  9mL GADAVIST GADOBUTROL 1 MMOL/ML IV SOLN

[Series 3: T2 fat-sat · axial · 6.0mm · 1.25mm/px · 1 of 40 slices shown]
[im 1/40]
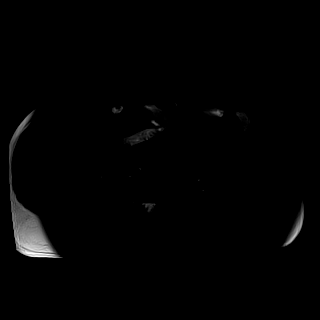

[Series 6: cor_3d_spc_trig · coronal · 1.0mm · 0.49mm/px · 2 of 85 slices shown]
[im 1/85]
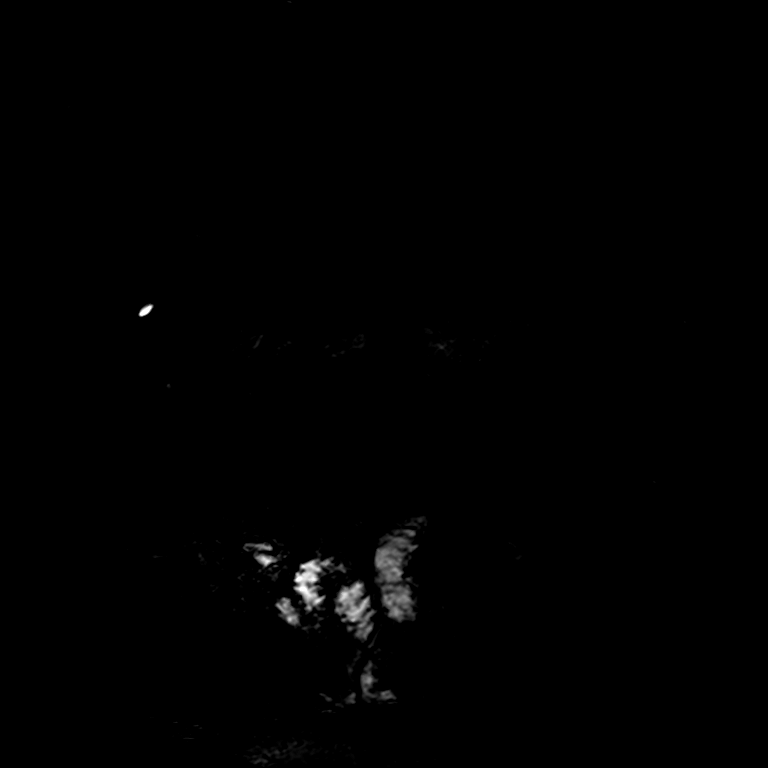
[im 85/85]
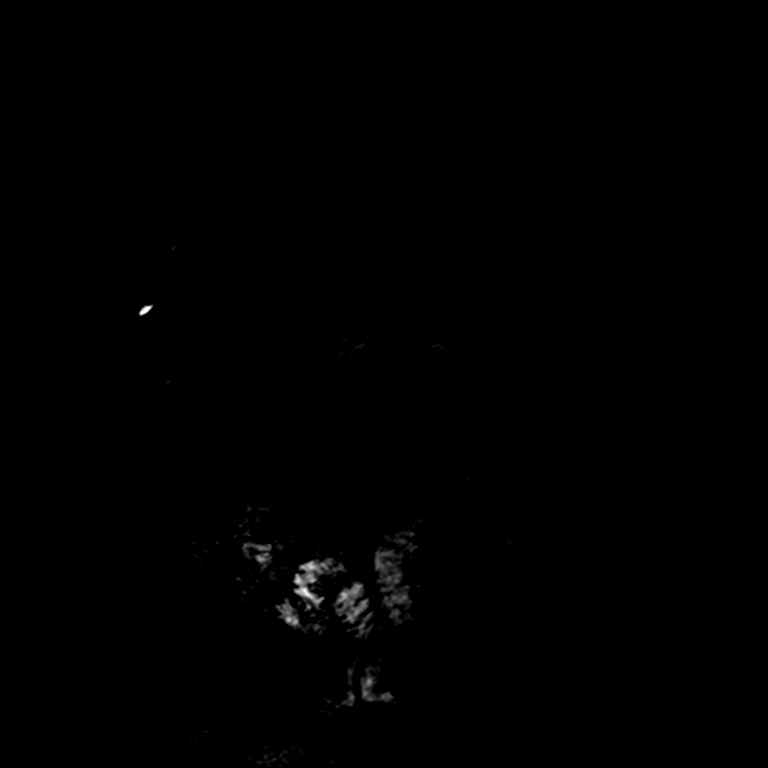

[Series 8: DWI · axial · 6.0mm · 1.49mm/px · z∈[-135,+145]mm · 2 of 80 slices shown (1 of 2)]
[im 1/80]
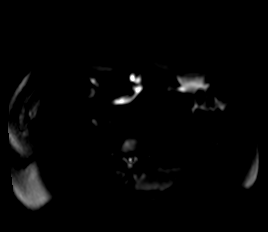
[im 80/80]
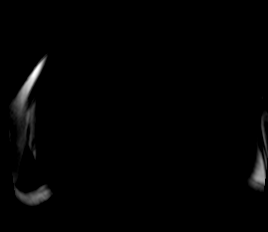

[Series 9: DWI · axial · 6.0mm · 1.49mm/px · 1 of 40 slices shown (2 of 2)]
[im 1/40]
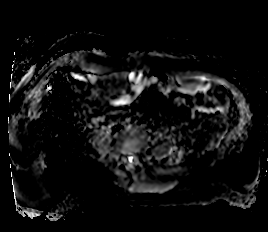

[Series 11: T2 · coronal · 6.0mm · 1.48mm/px · 1 of 36 slices shown (1 of 2)]
[im 1/36]
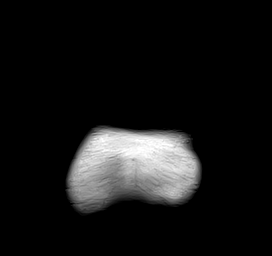

[Series 12: T1 · axial · 3.0mm · 1.25mm/px · z∈[-124,+113]mm · 3 of 80 slices shown (1 of 2)]
[im 1/80]
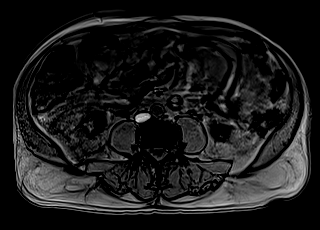
[im 40/80]
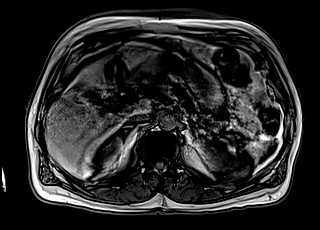
[im 80/80]
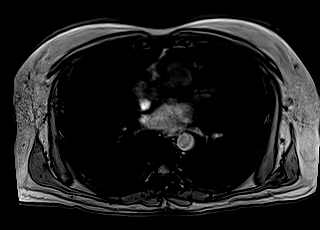

[Series 13: T1 · axial · 3.0mm · 1.25mm/px · z∈[-124,+113]mm · 3 of 80 slices shown (2 of 2)]
[im 1/80]
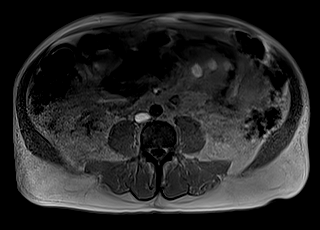
[im 40/80]
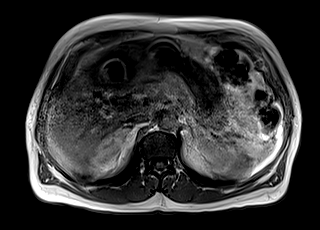
[im 80/80]
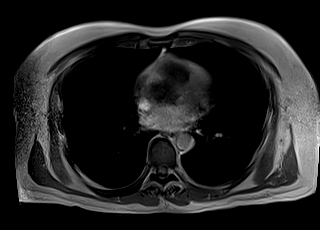

[Series 14: cor obl thk · sagittal · 50.0mm · 0.78mm/px · 1 of 9 slices shown]
[im 1/9]
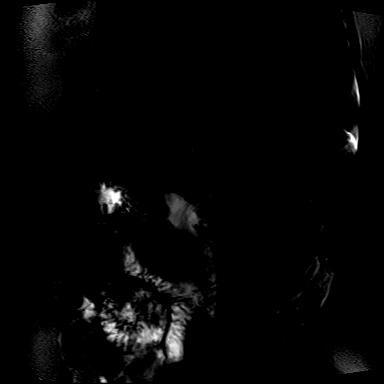

[Series 15: T2 · axial · 6.0mm · 1.56mm/px · 1 of 38 slices shown (2 of 2)]
[im 1/38]
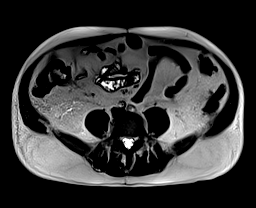

[Series 17: T1 dynamic · axial · 3.0mm · 1.25mm/px · z∈[-147,+114]mm · 3 of 88 slices shown (1 of 7)]
[im 1/88]
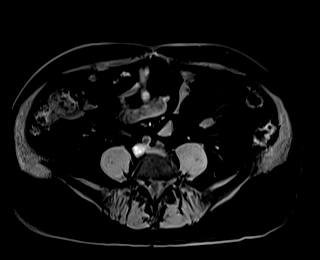
[im 44/88]
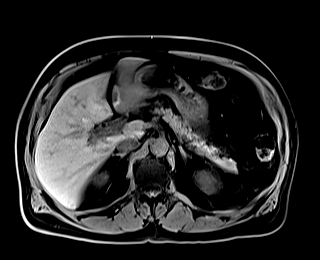
[im 88/88]
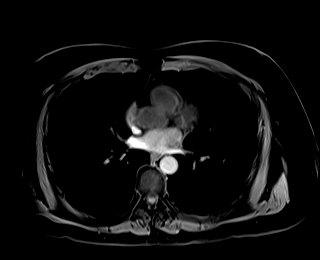

[Series 20: T1 dynamic · axial · 3.0mm · 1.25mm/px · z∈[-147,+114]mm · 3 of 88 slices shown (2 of 7)]
[im 1/88]
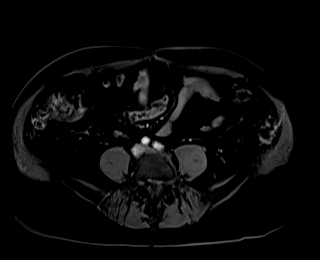
[im 44/88]
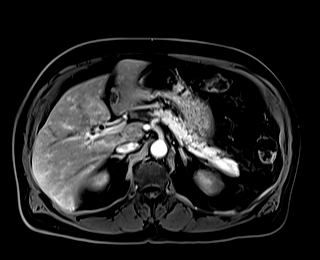
[im 88/88]
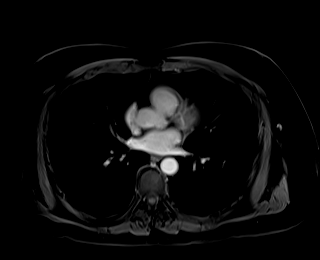

[Series 22: T1 dynamic · axial · 3.0mm · 1.25mm/px · z∈[-147,+114]mm · 3 of 88 slices shown (3 of 7)]
[im 1/88]
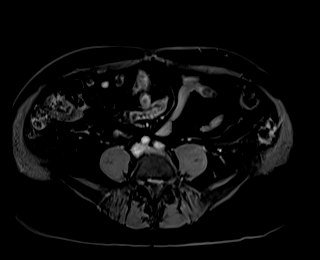
[im 44/88]
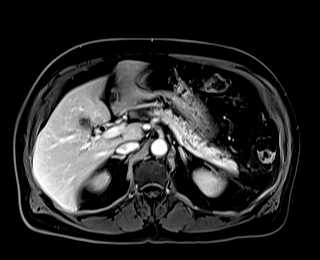
[im 88/88]
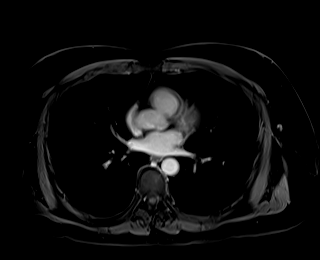

[Series 24: T1 dynamic · axial · 3.0mm · 1.25mm/px · z∈[-147,+114]mm · 3 of 88 slices shown (4 of 7)]
[im 1/88]
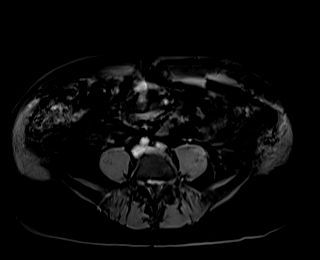
[im 44/88]
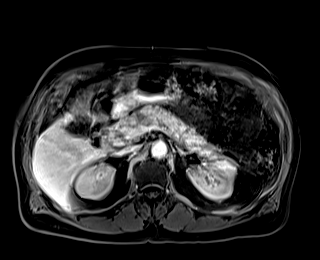
[im 88/88]
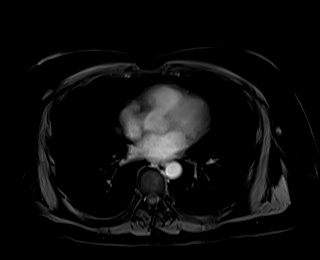

[Series 26: T1 dynamic · coronal · 5.0mm · 1.41mm/px · 2 of 52 slices shown (5 of 7)]
[im 1/52]
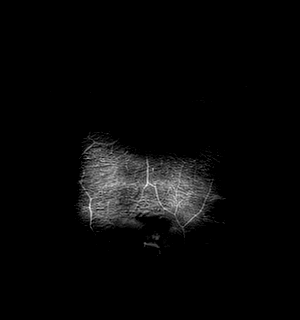
[im 52/52]
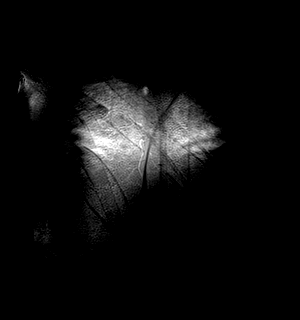

[Series 28: T1 dynamic · axial · 3.0mm · 1.25mm/px · z∈[-147,+114]mm · 3 of 88 slices shown (6 of 7)]
[im 1/88]
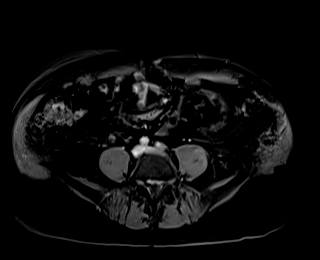
[im 44/88]
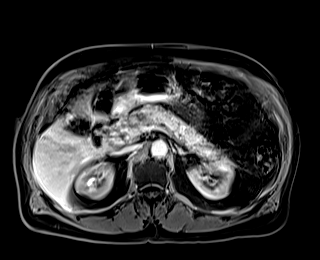
[im 88/88]
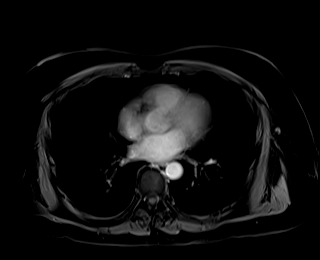

[Series 30: T1 dynamic · coronal · 5.0mm · 1.41mm/px · 2 of 48 slices shown (7 of 7)]
[im 1/48]
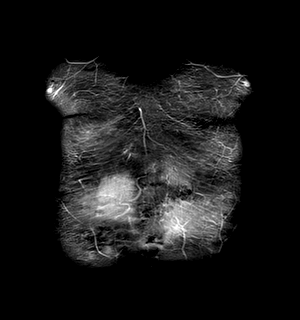
[im 48/48]
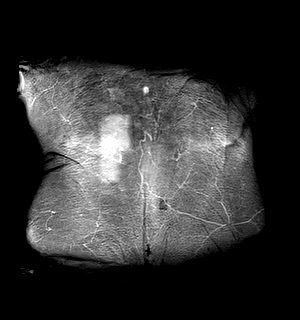

[Series 101: sub 20 sec · axial · 3.0mm · 1.25mm/px · z∈[-147,+114]mm · 3 of 88 slices shown]
[im 1/88]
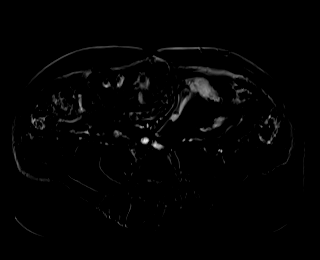
[im 44/88]
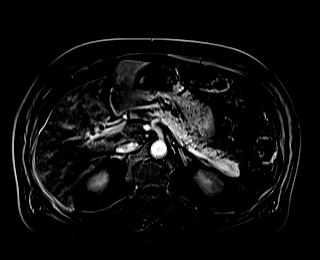
[im 88/88]
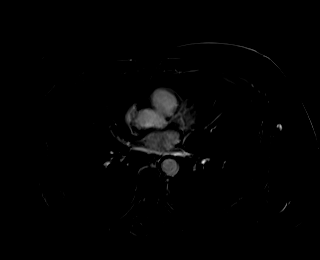

[Series 102: sub 45 sec · axial · 3.0mm · 1.25mm/px · z∈[-147,+114]mm · 3 of 88 slices shown]
[im 1/88]
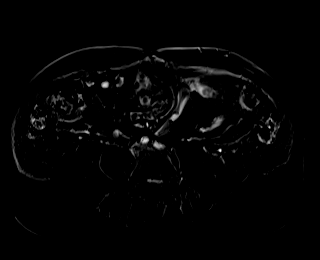
[im 44/88]
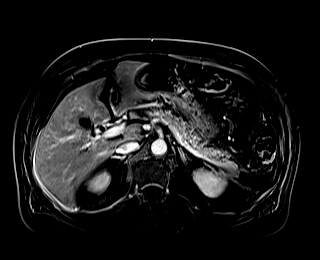
[im 88/88]
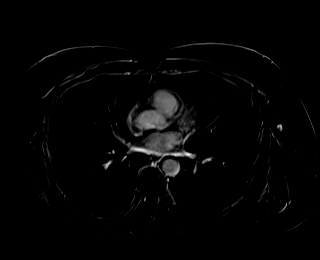

[Series 103: sub 90 sec · axial · 3.0mm · 1.25mm/px · z∈[-147,+114]mm · 3 of 88 slices shown]
[im 1/88]
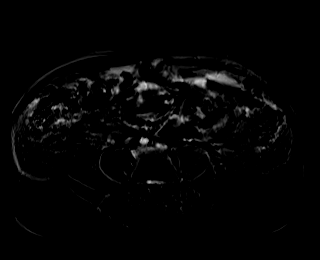
[im 44/88]
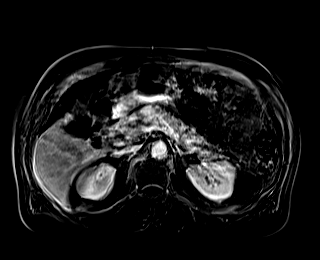
[im 88/88]
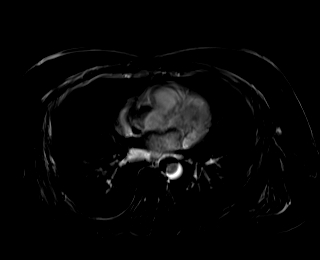

[43 of 48 positions shown; findings below may reference images not displayed]

FINDINGS: Lower chest: No acute findings.

Hepatobiliary: No hepatic masses identified. Prior cholecystectomy
noted. No abnormal fluid collections are seen. Previously seen
diffuse biliary ductal dilatation is resolved since prior study,
with common bile duct measuring 7 mm in diameter. No evidence of
choledocholithiasis.

Pancreas: Mild peripancreatic edema is seen mainly in the region of
the tail, and appears improved since previous study. No evidence of
pancreatic mass or necrosis. No peripancreatic fluid collections
identified. No evidence of pancreatic ductal dilatation or pancreas
divisum.

Spleen:  Within normal limits in size and appearance.

Adrenals/Urinary Tract: No masses identified. No evidence of
hydronephrosis. Symmetric bilateral perinephric stranding, which is
nonspecific and of doubtful clinical significance.

Stomach/Bowel: No evidence of dilated bowel loops. Diffuse colonic
diverticulosis incidentally noted.

Vascular/Lymphatic: No pathologically enlarged lymph nodes
identified. No abdominal aortic aneurysm.

Other:  None.

Musculoskeletal:  No suspicious bone lesions identified.
IMPRESSION: Improving mild acute pancreatitis. No evidence of pancreatic mass,
necrosis, or other complication.

Prior cholecystectomy. Resolution of biliary ductal dilatation since
prior CT. No radiographic evidence of choledocholithiasis or other
obstructing etiology.

Colonic diverticulosis.
